# Patient Record
Sex: Male | Born: 1935 | Race: White | Hispanic: No | Marital: Single | Smoking: Former smoker
Health system: Southern US, Community
[De-identification: ages and names within clinical notes are randomized; demographics above are authoritative.]

## PROBLEM LIST (undated history)

## (undated) DIAGNOSIS — I10 Essential (primary) hypertension: Secondary | ICD-10-CM

## (undated) DIAGNOSIS — N390 Urinary tract infection, site not specified: Secondary | ICD-10-CM

## (undated) DIAGNOSIS — R918 Other nonspecific abnormal finding of lung field: Secondary | ICD-10-CM

## (undated) DIAGNOSIS — E7800 Pure hypercholesterolemia, unspecified: Secondary | ICD-10-CM

## (undated) DIAGNOSIS — C3490 Malignant neoplasm of unspecified part of unspecified bronchus or lung: Secondary | ICD-10-CM

## (undated) DIAGNOSIS — E079 Disorder of thyroid, unspecified: Secondary | ICD-10-CM

## (undated) DIAGNOSIS — R5383 Other fatigue: Secondary | ICD-10-CM

## (undated) DIAGNOSIS — N289 Disorder of kidney and ureter, unspecified: Secondary | ICD-10-CM

## (undated) DIAGNOSIS — M79673 Pain in unspecified foot: Secondary | ICD-10-CM

## (undated) DIAGNOSIS — K922 Gastrointestinal hemorrhage, unspecified: Secondary | ICD-10-CM

## (undated) DIAGNOSIS — I2510 Atherosclerotic heart disease of native coronary artery without angina pectoris: Secondary | ICD-10-CM

## (undated) DIAGNOSIS — K8020 Calculus of gallbladder without cholecystitis without obstruction: Secondary | ICD-10-CM

## (undated) DIAGNOSIS — D649 Anemia, unspecified: Secondary | ICD-10-CM

## (undated) DIAGNOSIS — C801 Malignant (primary) neoplasm, unspecified: Secondary | ICD-10-CM

## (undated) DIAGNOSIS — E669 Obesity, unspecified: Secondary | ICD-10-CM

## (undated) HISTORY — DX: Pain in unspecified foot: M79.673

## (undated) HISTORY — PX: ELBOW SURGERY: SHX618

## (undated) HISTORY — DX: Calculus of gallbladder without cholecystitis without obstruction: K80.20

## (undated) HISTORY — DX: Pure hypercholesterolemia, unspecified: E78.00

## (undated) HISTORY — PX: CATARACT EXTRACTION: SUR2

## (undated) HISTORY — DX: Malignant (primary) neoplasm, unspecified: C80.1

## (undated) HISTORY — PX: CHOLECYSTECTOMY: SHX55

## (undated) HISTORY — PX: ESOPHAGEAL DILATION: SHX303

## (undated) HISTORY — DX: Anemia, unspecified: D64.9

## (undated) HISTORY — PX: OTHER SURGICAL HISTORY: SHX169

## (undated) HISTORY — DX: Essential (primary) hypertension: I10

## (undated) HISTORY — DX: Atherosclerotic heart disease of native coronary artery without angina pectoris: I25.10

## (undated) HISTORY — DX: Malignant neoplasm of unspecified part of unspecified bronchus or lung: C34.90

## (undated) HISTORY — DX: Gastrointestinal hemorrhage, unspecified: K92.2

## (undated) HISTORY — DX: Disorder of thyroid, unspecified: E07.9

## (undated) HISTORY — DX: Urinary tract infection, site not specified: N39.0

## (undated) HISTORY — DX: Disorder of kidney and ureter, unspecified: N28.9

## (undated) HISTORY — PX: PILONIDAL CYST EXCISION: SHX744

## (undated) HISTORY — DX: Other nonspecific abnormal finding of lung field: R91.8

## (undated) HISTORY — DX: Obesity, unspecified: E66.9

## (undated) HISTORY — DX: Other fatigue: R53.83

---

## 2005-07-29 HISTORY — PX: COLONOSCOPY W/ POLYPECTOMY: SHX1380

## 2008-12-30 ENCOUNTER — Ambulatory Visit (HOSPITAL_COMMUNITY): Admission: RE | Admit: 2008-12-30 | Discharge: 2008-12-30 | Payer: Self-pay | Admitting: Internal Medicine

## 2009-06-01 ENCOUNTER — Ambulatory Visit: Payer: Self-pay | Admitting: Cardiothoracic Surgery

## 2009-06-07 ENCOUNTER — Encounter: Payer: Self-pay | Admitting: Cardiovascular Disease

## 2009-06-08 ENCOUNTER — Encounter: Payer: Self-pay | Admitting: Cardiovascular Disease

## 2009-06-08 ENCOUNTER — Ambulatory Visit: Payer: Self-pay | Admitting: Cardiothoracic Surgery

## 2009-06-08 DIAGNOSIS — E0590 Thyrotoxicosis, unspecified without thyrotoxic crisis or storm: Secondary | ICD-10-CM | POA: Insufficient documentation

## 2009-06-12 DIAGNOSIS — R222 Localized swelling, mass and lump, trunk: Secondary | ICD-10-CM

## 2009-06-12 DIAGNOSIS — K8020 Calculus of gallbladder without cholecystitis without obstruction: Secondary | ICD-10-CM | POA: Insufficient documentation

## 2009-06-12 DIAGNOSIS — Z8711 Personal history of peptic ulcer disease: Secondary | ICD-10-CM | POA: Insufficient documentation

## 2009-06-12 DIAGNOSIS — E669 Obesity, unspecified: Secondary | ICD-10-CM | POA: Insufficient documentation

## 2009-06-12 DIAGNOSIS — E119 Type 2 diabetes mellitus without complications: Secondary | ICD-10-CM

## 2009-06-12 DIAGNOSIS — M79609 Pain in unspecified limb: Secondary | ICD-10-CM

## 2009-06-12 DIAGNOSIS — E7800 Pure hypercholesterolemia, unspecified: Secondary | ICD-10-CM | POA: Insufficient documentation

## 2009-06-12 DIAGNOSIS — I2510 Atherosclerotic heart disease of native coronary artery without angina pectoris: Secondary | ICD-10-CM | POA: Insufficient documentation

## 2009-06-12 DIAGNOSIS — N289 Disorder of kidney and ureter, unspecified: Secondary | ICD-10-CM | POA: Insufficient documentation

## 2009-06-12 DIAGNOSIS — R5381 Other malaise: Secondary | ICD-10-CM

## 2009-06-12 DIAGNOSIS — D649 Anemia, unspecified: Secondary | ICD-10-CM | POA: Insufficient documentation

## 2009-06-12 DIAGNOSIS — R5383 Other fatigue: Secondary | ICD-10-CM

## 2009-06-12 DIAGNOSIS — I10 Essential (primary) hypertension: Secondary | ICD-10-CM | POA: Insufficient documentation

## 2009-06-12 DIAGNOSIS — N390 Urinary tract infection, site not specified: Secondary | ICD-10-CM | POA: Insufficient documentation

## 2009-06-13 ENCOUNTER — Encounter (INDEPENDENT_AMBULATORY_CARE_PROVIDER_SITE_OTHER): Payer: Self-pay | Admitting: *Deleted

## 2009-06-13 ENCOUNTER — Ambulatory Visit: Payer: Self-pay | Admitting: Cardiovascular Disease

## 2009-06-13 LAB — CONVERTED CEMR LAB
BUN: 21 mg/dL (ref 6–23)
Basophils Relative: 0.5 % (ref 0.0–3.0)
Calcium: 9.4 mg/dL (ref 8.4–10.5)
Eosinophils Absolute: 0.1 10*3/uL (ref 0.0–0.7)
Eosinophils Relative: 2 % (ref 0.0–5.0)
Glucose, Bld: 86 mg/dL (ref 70–99)
HCT: 44.3 % (ref 39.0–52.0)
Lymphocytes Relative: 24.3 % (ref 12.0–46.0)
Lymphs Abs: 1.8 10*3/uL (ref 0.7–4.0)
Monocytes Absolute: 0.7 10*3/uL (ref 0.1–1.0)
Monocytes Relative: 9.2 % (ref 3.0–12.0)
Neutrophils Relative %: 64 % (ref 43.0–77.0)
Potassium: 4.6 meq/L (ref 3.5–5.1)
Prothrombin Time: 11.2 s (ref 9.1–11.7)
Sodium: 142 meq/L (ref 135–145)

## 2009-06-14 ENCOUNTER — Inpatient Hospital Stay (HOSPITAL_COMMUNITY): Admission: AD | Admit: 2009-06-14 | Discharge: 2009-06-17 | Payer: Self-pay | Admitting: Cardiovascular Disease

## 2009-06-14 ENCOUNTER — Ambulatory Visit: Payer: Self-pay | Admitting: Cardiovascular Disease

## 2009-06-14 ENCOUNTER — Encounter: Payer: Self-pay | Admitting: Cardiovascular Disease

## 2009-06-14 ENCOUNTER — Ambulatory Visit: Payer: Self-pay | Admitting: Cardiothoracic Surgery

## 2009-06-14 ENCOUNTER — Inpatient Hospital Stay (HOSPITAL_BASED_OUTPATIENT_CLINIC_OR_DEPARTMENT_OTHER): Admission: RE | Admit: 2009-06-14 | Discharge: 2009-06-14 | Payer: Self-pay | Admitting: Cardiovascular Disease

## 2009-06-16 ENCOUNTER — Encounter: Payer: Self-pay | Admitting: Cardiovascular Disease

## 2009-06-19 ENCOUNTER — Telehealth: Payer: Self-pay | Admitting: Cardiovascular Disease

## 2009-06-26 ENCOUNTER — Telehealth: Payer: Self-pay | Admitting: Cardiovascular Disease

## 2009-06-28 HISTORY — PX: ELECTROMAGNETIC NAVIGATION BROCHOSCOPY: SHX5369

## 2009-06-28 HISTORY — PX: OTHER SURGICAL HISTORY: SHX169

## 2009-07-12 ENCOUNTER — Encounter: Payer: Self-pay | Admitting: Cardiovascular Disease

## 2009-07-27 ENCOUNTER — Ambulatory Visit: Payer: Self-pay | Admitting: Cardiothoracic Surgery

## 2009-07-27 ENCOUNTER — Encounter: Admission: RE | Admit: 2009-07-27 | Discharge: 2009-07-27 | Payer: Self-pay | Admitting: Cardiothoracic Surgery

## 2009-07-27 ENCOUNTER — Ambulatory Visit: Payer: Self-pay | Admitting: Cardiovascular Disease

## 2009-09-21 ENCOUNTER — Telehealth: Payer: Self-pay | Admitting: Cardiovascular Disease

## 2009-09-22 ENCOUNTER — Telehealth: Payer: Self-pay | Admitting: Cardiovascular Disease

## 2009-10-17 ENCOUNTER — Ambulatory Visit: Payer: Self-pay | Admitting: Cardiovascular Disease

## 2009-10-26 ENCOUNTER — Encounter: Admission: RE | Admit: 2009-10-26 | Discharge: 2009-10-26 | Payer: Self-pay | Admitting: Cardiothoracic Surgery

## 2009-10-26 ENCOUNTER — Ambulatory Visit: Payer: Self-pay | Admitting: Cardiothoracic Surgery

## 2010-01-18 ENCOUNTER — Encounter: Admission: RE | Admit: 2010-01-18 | Discharge: 2010-01-18 | Payer: Self-pay | Admitting: Cardiothoracic Surgery

## 2010-01-18 ENCOUNTER — Ambulatory Visit: Payer: Self-pay | Admitting: Cardiothoracic Surgery

## 2010-01-18 ENCOUNTER — Encounter: Payer: Self-pay | Admitting: Cardiovascular Disease

## 2010-03-15 ENCOUNTER — Telehealth: Payer: Self-pay | Admitting: Cardiovascular Disease

## 2010-04-11 ENCOUNTER — Ambulatory Visit: Payer: Self-pay | Admitting: Cardiovascular Disease

## 2010-07-09 ENCOUNTER — Telehealth: Payer: Self-pay | Admitting: Cardiovascular Disease

## 2010-07-19 ENCOUNTER — Encounter
Admission: RE | Admit: 2010-07-19 | Discharge: 2010-07-19 | Payer: Self-pay | Source: Home / Self Care | Attending: Cardiothoracic Surgery | Admitting: Cardiothoracic Surgery

## 2010-07-19 ENCOUNTER — Encounter: Payer: Self-pay | Admitting: Cardiovascular Disease

## 2010-07-19 ENCOUNTER — Ambulatory Visit: Payer: Self-pay | Admitting: Cardiothoracic Surgery

## 2010-08-28 NOTE — Assessment & Plan Note (Signed)
Summary: F6M/DM   CC:  check up.  History of Present Illness: Brent Mcguire is seen today for F/U of CAD and dyspnea.  He has a tight ostial circ lesion that is not amenable to PCI due to its proximity to the LM.  He had succesful left thoracotomy in [place] by Dr Tyrone Sage the first week in December.  The lung nodule was cancerous but it appears to be contained by surgery.  He had some hemoroidal bleeding and his Plavix in the past.  He is on Dexilant for reflux and I told him Protonix might be best once his samples run out in regards to an interaction with his antiplatlet drugs.  If he needs to be on long term H2 blockade checking his AD2P reactivity and platlet inhibition may be worthwhile. He has not had any SSCP, palpitations, dyspnea syncope or edema.   Since I last saw him he had his gallbladder out in [place].  He had no cardiac complications.  Since he has been on Plavix he has not had any angina.  Since he does have a critical ostial circ lesion not amenable to PCI I prefer to keep him on Plavix to prevent MI.  If his angina returns he may be a candidate for CABG now that his lung CA seems to be cured.  Dr Tyrone Sage indicated he did not need adjuvant XRT  I encouraged him to try to lose weight.  He will have a F/U myovue to check ishcemic burden in 6 months.  Current Problems (verified): 1)  Coronary Atherosclerosis  (ICD-414.00) 2)  Hypertension  (ICD-401.9) 3)  Uti  (ICD-599.0) 4)  Mass, Lung  (ICD-786.6) 5)  Foot Pain  (ICD-729.5) 6)  Fatigue  (ICD-780.79) 7)  Kidney Disease  (ICD-593.9) 8)  Cholelithiasis  (ICD-574.20) 9)  Anemia  (ICD-285.9) 10)  Gastric Ulcer, Hx of  (ICD-V12.71) 11)  Dm  (ICD-250.00) 12)  Hypercholesterolemia  (ICD-272.0) 13)  Obesity  (ICD-278.00) 14)  Hyperthyroidism  (ICD-242.90)  Current Medications (verified): 1)  Levothroid 137 Mcg Tabs (Levothyroxine Sodium) .Marland Kitchen.. 1 Tab By Mouth Once Daily 2)  Doxazosin Mesylate 8 Mg Tabs (Doxazosin Mesylate) .Marland Kitchen.. 1 Tab  By Mouth Once Daily 3)  Simvastatin 40 Mg Tabs (Simvastatin) .... Take One Tablet By Mouth Daily At Bedtime 4)  Aspirin 81 Mg Tbec (Aspirin) .... Take One Tablet By Mouth Daily 5)  Triamterene-Hctz 37.5-25 Mg Tabs (Triamterene-Hctz) .... One Half Tab By Mouth Once Daily 6)  Carvedilol 6.25 Mg Tabs (Carvedilol) .... Take One Tablet By Mouth Twice A Day 7)  Multivitamins   Tabs (Multiple Vitamin) .Marland Kitchen.. 1 Tab By Mouth Once Daily 8)  Dexilant 60 Mg Cpdr (Dexlansoprazole) .Marland Kitchen.. 1 Tab By Mouth Once Daily 9)  Plavix 75 Mg Tabs (Clopidogrel Bisulfate) .... Take One Tablet By Mouth Daily 10)  Fish Oil 1000 Mg Caps (Omega-3 Fatty Acids) .... One Tablet By Mouth Once Daily  Allergies (verified): No Known Drug Allergies  Past History:  Past Medical History: Last updated: 06/08/2009 Current Problems:  CORONARY ATHEROSCLEROSIS (ICD-414.00) needs lung resection had positive myouve in [place] with Dr. Sherlyn Lick inferolateral ischemia.  Smoker.  Evaluation for cath pre-surgery HYPERTENSION (ICD-401.9) UTI (ICD-599.0) MASS, LUNG (ICD-786.6) evaluated by Tyrone Sage PET borderline positive.  ? lung resection or biopsy FOOT PAIN (ICD-729.5) FATIGUE (ICD-780.79) KIDNEY DISEASE (ICD-593.9) CHOLELITHIASIS (ICD-574.20) ANEMIA (ICD-285.9) GASTRIC ULCER, HX OF (ICD-V12.71) DM (ICD-250.00) HYPERCHOLESTEROLEMIA (ICD-272.0) OBESITY (ICD-278.00) HYPERTHYROIDISM (ICD-242.90) GI bleed about a year ago  Significant for gallstones hyperplasia  Past Surgical History:  Last updated: 06/08/2009  Elbow surgery and pilonidal cyst years ago cataract surgery colonoscopic polypectomy via colostomy; 3 polyps. Brent Mcguire 2007 pilonidal cyst resection-simple treatment of elbow fracture  Family History: Last updated: 06/08/2009 No previous history of lung cancer in his family.   Denies alcohol use.   Social History: Last updated: 06/08/2009  The patient is single, lives alone, retired in [employer].      Review of Systems        Denies fever, malais, weight loss, blurry vision, decreased visual acuity, cough, sputum,, hemoptysis, pleuritic pain, palpitaitons, heartburn, abdominal pain, melena, lower extremity edema, claudication, or rash.   Vital Signs:  Patient profile:   76 year old male Height:      73 inches Weight:      283 pounds BMI:     37.47 Pulse rate:   94 / minute Resp:     12 per minute BP sitting:   121 / 78  (left arm)  Vitals Entered By: Brent Mcguire (April 11, 2010 3:19 PM)  Physical Exam  General:  Affect appropriate Healthy:  appears stated age HEENT: normal Neck supple with no adenopathy JVP normal no bruits no thyromegaly Lungs clear with no wheezing and good diaphragmatic motion  Decreased breath sounds left side Heart:  S1/S2 no murmur,rub, gallop or click PMI normal Abdomen: benighn, BS positve, no tenderness, no AAA no bruit.  No HSM or HJR Distal pulses intact with no bruits No edema Neuro non-focal Skin warm and dry S/P left thoracotomy   Impression & Recommendations:  Problem # 1:  CORONARY ATHEROSCLEROSIS (ICD-414.00) Stable no angina. Ostial circ.  F/U myovue 6 months His updated medication list for this problem includes:    Aspirin 81 Mg Tbec (Aspirin) .Marland Kitchen... Take one tablet by mouth daily    Carvedilol 6.25 Mg Tabs (Carvedilol) .Marland Kitchen... Take one tablet by mouth twice a day    Plavix 75 Mg Tabs (Clopidogrel bisulfate) .Marland Kitchen... Take one tablet by mouth daily  Problem # 2:  HYPERTENSION (ICD-401.9) Well controlled His updated medication list for this problem includes:    Doxazosin Mesylate 8 Mg Tabs (Doxazosin mesylate) .Marland Kitchen... 1 tab by mouth once daily    Aspirin 81 Mg Tbec (Aspirin) .Marland Kitchen... Take one tablet by mouth daily    Triamterene-hctz 37.5-25 Mg Tabs (Triamterene-hctz) ..... One half tab by mouth once daily    Carvedilol 6.25 Mg Tabs (Carvedilol) .Marland Kitchen... Take one tablet by mouth twice a day  Problem # 3:  HYPERCHOLESTEROLEMIA (ICD-272.0) Continue statin.   Labs in 6 months His updated medication list for this problem includes:    Simvastatin 40 Mg Tabs (Simvastatin) .Marland Kitchen... Take one tablet by mouth daily at bedtime  Problem # 4:  MASS, LUNG (ICD-786.6) Chronic dyspnea related to recentl left lung resection.  Low carb diet and exercise program discussed.  F/U [facility] surv. CT/CXR EF normal.  F/U myovue in 6 months  Patient Instructions: 1)  Your physician recommends that you schedule a follow-up appointment in: 6 MONTHS WITH DR Eden Emms 2)  Your physician recommends that you continue on your current medications as directed. Please refer to the Current Medication list given to you today.

## 2010-08-28 NOTE — Progress Notes (Signed)
Summary: need help with plavix  Phone Note Call from Patient Call back at Home Phone [phone]   Caller: Patient Reason for Call: Talk to Nurse Details for Reason: per pt calling, insurance won't cover plavix anymore, [facility] so he can receive help.  Initial call taken by: Lorne Skeens,  September 22, 2009 8:38 AM  Follow-up for Phone Call        spoke with pt, copy of last office [phone] copy of note also mailed to pt Deliah Goody, RN  September 22, 2009 9:40 AM

## 2010-08-28 NOTE — Letter (Signed)
Summary: [facility]  [facility]   Imported By: Roderic Ovens 08/01/2009 15:15:07  _____________________________________________________________________  External Attachment:    Type:   Image     Comment:   External Document

## 2010-08-28 NOTE — Miscellaneous (Signed)
  Clinical Lists Changes  Orders: Added new Referral order of Nuclear Stress Test (Nuc Stress Test) - Signed Observations: Added new observation of PI CARDIO: Your physician has requested that you have an exercise stress myoview.  For further information please visit [email or web address]  Please follow instruction sheet, as given. (04/11/2010 16:06)      Patient Instructions: 1)  Your physician has requested that you have an exercise stress myoview.  For further information please visit [email or web address]  Please follow instruction sheet, as given.

## 2010-08-28 NOTE — Cardiovascular Report (Signed)
Summary: [facility]   [facility]   Imported By: Roderic Ovens 08/09/2009 15:14:53  _____________________________________________________________________  External Attachment:    Type:   Image     Comment:   External Document

## 2010-08-28 NOTE — Letter (Signed)
Summary: [facility]   [facility]   Imported By: Roderic Ovens 02/10/2010 11:25:06  _____________________________________________________________________  External Attachment:    Type:   Image     Comment:   External Document

## 2010-08-28 NOTE — Progress Notes (Signed)
Summary: refill request  Phone Note Refill Request Message from:  Patient on March 15, 2010 8:52 AM  Refills Requested: Medication #1:  CARVEDILOL 6.25 MG TABS Take one tablet by mouth twice a day  Medication #2:  SIMVASTATIN 40 MG TABS Take one tablet by mouth daily at bedtime  Medication #3:  PLAVIX 75 MG TABS Take one tablet by mouth daily pls call to [facility] mail order   Method Requested: Telephone to Pharmacy Initial call taken by: Glynda Jaeger,  March 15, 2010 8:52 AM    Prescriptions: PLAVIX 75 MG TABS (CLOPIDOGREL BISULFATE) Take one tablet by mouth daily  #90 x 3   Entered by:   Kem Parkinson   Authorized by:   Colon Branch, MD, [facility]   Signed by:   Kem Parkinson on 03/16/2010   Method used:   Faxed to ...       [facility] (mail-order)             , [place]         Ph: [phone]       Fax: [phone]   RxID:   [id number] CARVEDILOL 6.25 MG TABS (CARVEDILOL) Take one tablet by mouth twice a day  #180 x 3   Entered by:   Kem Parkinson   Authorized by:   Colon Branch, MD, [facility]   Signed by:   Kem Parkinson on 03/16/2010   Method used:   Faxed to ...       [facility] (mail-order)             , [place]         Ph: [phone]       Fax: [phone]   RxID:   [id number] SIMVASTATIN 40 MG TABS (SIMVASTATIN) Take one tablet by mouth daily at bedtime  #90 x 3   Entered by:   Kem Parkinson   Authorized by:   Colon Branch, MD, [facility]   Signed by:   Kem Parkinson on 03/16/2010   Method used:   Faxed to ...       [facility] (mail-order)             , [place]         Ph: [phone]       Fax: [phone]   RxID:   [id number]

## 2010-08-28 NOTE — Assessment & Plan Note (Signed)
Summary: F3M/DM   History of Present Illness: Brent Mcguire is seen today for F/U of CAD and dyspnea.  He has a tight ostial circ lesion that is not amenable to PCI due to its proximity to the LM.  He had succesful left thoracotomy in [place] by Dr Tyrone Sage the first week in December.  The lung nodule was cancerous but it appears to be contained by surgery.  He had some hemoroidal bleeding and his Plavix in the past.  He is on Dexilant for reflux and I told him Protonix might be best once his samples run out in regards to an interaction with his antiplatlet drugs.  If he needs to be on long term H2 blockade checking his AD2P reactivity and platlet inhibition may be worthwhile. He has not had any SSCP, palpitations, dyspnea syncope or edema.   Since I last saw him he had his gallbladder out in [place].  He had no cardiac complications.  Since he has been on Plavix he has not had any angina.  Since he does have a critical ostial circ lesion not amenable to PCI I prefer to keep him on Plavix to prevent MI.  If his angina returns he may be a candidate for CABG now that his lung CA seems to be cured.  Dr Tyrone Sage indicated he did not need adjuvant XRT.  Current Problems (verified): 1)  Coronary Atherosclerosis  (ICD-414.00) 2)  Hypertension  (ICD-401.9) 3)  Uti  (ICD-599.0) 4)  Mass, Lung  (ICD-786.6) 5)  Foot Pain  (ICD-729.5) 6)  Fatigue  (ICD-780.79) 7)  Kidney Disease  (ICD-593.9) 8)  Cholelithiasis  (ICD-574.20) 9)  Anemia  (ICD-285.9) 10)  Gastric Ulcer, Hx of  (ICD-V12.71) 11)  Dm  (ICD-250.00) 12)  Hypercholesterolemia  (ICD-272.0) 13)  Obesity  (ICD-278.00) 14)  Hyperthyroidism  (ICD-242.90)  Current Medications (verified): 1)  Levothroid 137 Mcg Tabs (Levothyroxine Sodium) .Marland Kitchen.. 1 Tab By Mouth Once Daily 2)  Doxazosin Mesylate 8 Mg Tabs (Doxazosin Mesylate) .Marland Kitchen.. 1 Tab By Mouth Once Daily 3)  Simvastatin 40 Mg Tabs (Simvastatin) .... Take One Tablet By Mouth Daily At Bedtime 4)  Aspirin 81  Mg Tbec (Aspirin) .... Take One Tablet By Mouth Daily 5)  Triamterene-Hctz 37.5-25 Mg Tabs (Triamterene-Hctz) .... One Half Tab By Mouth Once Daily 6)  Carvedilol 6.25 Mg Tabs (Carvedilol) .... Take One Tablet By Mouth Twice A Day 7)  Multivitamins   Tabs (Multiple Vitamin) .Marland Kitchen.. 1 Tab By Mouth Once Daily 8)  Dexilant 60 Mg Cpdr (Dexlansoprazole) .Marland Kitchen.. 1 Tab By Mouth Once Daily 9)  Plavix 75 Mg Tabs (Clopidogrel Bisulfate) .... Take One Tablet By Mouth Daily 10)  Fish Oil 1000 Mg Caps (Omega-3 Fatty Acids) .... One Tablet By Mouth Once Daily 11)  Hydrocodone-Acetaminophen 5-500 Mg Tabs (Hydrocodone-Acetaminophen) .... 2 Tablets Every 4 Hours As Needed  Allergies (verified): No Known Drug Allergies  Past History:  Past Medical History: Last updated: 06/08/2009 Current Problems:  CORONARY ATHEROSCLEROSIS (ICD-414.00) needs lung resection had positive myouve in [place] with Dr. Sherlyn Lick inferolateral ischemia.  Smoker.  Evaluation for cath pre-surgery HYPERTENSION (ICD-401.9) UTI (ICD-599.0) MASS, LUNG (ICD-786.6) evaluated by Tyrone Sage PET borderline positive.  ? lung resection or biopsy FOOT PAIN (ICD-729.5) FATIGUE (ICD-780.79) KIDNEY DISEASE (ICD-593.9) CHOLELITHIASIS (ICD-574.20) ANEMIA (ICD-285.9) GASTRIC ULCER, HX OF (ICD-V12.71) DM (ICD-250.00) HYPERCHOLESTEROLEMIA (ICD-272.0) OBESITY (ICD-278.00) HYPERTHYROIDISM (ICD-242.90) GI bleed about a year ago  Significant for gallstones hyperplasia  Past Surgical History: Last updated: 06/08/2009  Elbow surgery and pilonidal cyst years ago cataract surgery  colonoscopic polypectomy via colostomy; 3 polyps. Chales Abrahams 2007 pilonidal cyst resection-simple treatment of elbow fracture  Family History: Last updated: 06/08/2009 No previous history of lung cancer in his family.   Denies alcohol use.   Social History: Last updated: 06/08/2009  The patient is single, lives alone, retired in [employer].      Review of Systems       Denies  fever, malais, weight loss, blurry vision, decreased visual acuity, cough, sputum, SOB, hemoptysis, pleuritic pain, palpitaitons, heartburn, abdominal pain, melena, lower extremity edema, claudication, or rash.   Vital Signs:  Patient profile:   76 year old male Height:      73 inches Weight:      273 pounds BMI:     36.15 Pulse rate:   70 / minute Resp:     12 per minute BP sitting:   146 / 87  (left arm) Cuff size:   large  Vitals Entered By: Kem Parkinson (October 17, 2009 3:57 PM)  Physical Exam  General:  Affect appropriate Healthy:  appears stated age HEENT: normal Neck supple with no adenopathy JVP normal no bruits no thyromegaly Lungs clear with no wheezing and good diaphragmatic motion Heart:  S1/S2 no murmur,rub, gallop or click PMI normal Abdomen: benighn, BS positve, no tenderness, no AAA no bruit.  No HSM or HJR Distal pulses intact with no bruits No edema Neuro non-focal Skin warm and dry S/P right thoracotomy S/P lap choly   Impression & Recommendations:  Problem # 1:  CORONARY ATHEROSCLEROSIS (ICD-414.00) Should be kept on Plavix as part of his medical Rx given inablility to stent ostial circ and to prevent MI and need for CABG His updated medication list for this problem includes:    Aspirin 81 Mg Tbec (Aspirin) .Marland Kitchen... Take one tablet by mouth daily    Carvedilol 6.25 Mg Tabs (Carvedilol) .Marland Kitchen... Take one tablet by mouth twice a day    Plavix 75 Mg Tabs (Clopidogrel bisulfate) .Marland Kitchen... Take one tablet by mouth daily  Problem # 2:  HYPERTENSION (ICD-401.9) Well controlled His updated medication list for this problem includes:    Doxazosin Mesylate 8 Mg Tabs (Doxazosin mesylate) .Marland Kitchen... 1 tab by mouth once daily    Aspirin 81 Mg Tbec (Aspirin) .Marland Kitchen... Take one tablet by mouth daily    Triamterene-hctz 37.5-25 Mg Tabs (Triamterene-hctz) ..... One half tab by mouth once daily    Carvedilol 6.25 Mg Tabs (Carvedilol) .Marland Kitchen... Take one tablet by mouth twice a  day  Problem # 3:  HYPERCHOLESTEROLEMIA (ICD-272.0) F/U lipid and liver in 6 months  Target LDL 70 or less His updated medication list for this problem includes:    Simvastatin 40 Mg Tabs (Simvastatin) .Marland Kitchen... Take one tablet by mouth daily at bedtime  Problem # 4:  MASS, LUNG (ICD-786.6) S/P right lung resection by Dr Tyrone Sage.  No need for adjuvant chemo or XRT.  ? candidate for CABG if angina returns.  Patient Instructions: 1)  Your physician recommends that you schedule a follow-up appointment in: 6 MONTHS Prescriptions: PLAVIX 75 MG TABS (CLOPIDOGREL BISULFATE) Take one tablet by mouth daily  #60 x 12   Entered by:   Deliah Goody, RN   Authorized by:   Colon Branch, MD, [facility]   Signed by:   Deliah Goody, RN on 10/17/2009   Method used:   Electronically to        [facility]  [address] [phone]* (retail)       [address]  [place], [place]  [address]       Ph: [phone] or [phone]       Fax: [phone]   RxID:   [id number]

## 2010-08-28 NOTE — Letter (Signed)
Summary: [facility] Office Visit  [facility] Office Visit   Imported By: Kassie Mends 08/16/2009 13:19:19  _____________________________________________________________________  External Attachment:    Type:   Image     Comment:   External Document

## 2010-08-28 NOTE — Progress Notes (Signed)
Summary: plavix/LMOM or call back/ JR  Phone Note Call from Patient Call back at Home Phone [phone]   Caller: Patient Reason for Call: Talk to Nurse Summary of Call: plavix cost to much... pt req something else Initial call taken by: Migdalia Dk,  September 21, 2009 10:00 AM  Follow-up for Phone Call        [facility] for call back. Follow-up by: Suzan Garibaldi RN  Additional Follow-up for Phone Call Additional follow up Details #1::        Spoke with patient...Marland Kitchenhe has a $ 40.00 copay for 1 month of Plavix. He states that this is not affordable for him. Advised that he would not qualify for the indigent program for Plavix because he has insurance coverage. He will try to get the medication from the [place]. Additional Follow-up by: Suzan Garibaldi  RN

## 2010-08-30 NOTE — Progress Notes (Signed)
Summary: re stress test  Phone Note Call from Patient   Caller: Patient [phone] Reason for Call: Talk to Nurse Summary of Call: pt called to set up march fu with dr Eden Emms, was also to have stress test, however pt doesn't want to do the stress cannot walk, does dr Eden Emms want to set up something else?  Initial call taken by: Glynda Jaeger,  July 09, 2010 8:48 AM  Follow-up for Phone Call        spoke with pt, lexiscan myoview scheduled with a follow up sameday with dr Verdis Prime, RN  July 09, 2010 9:20 AM

## 2010-09-19 NOTE — Progress Notes (Signed)
Summary: [facility]: Office Visit  [facility]: Office Visit   Imported By: Earl Many 09/06/2010 17:44:14  _____________________________________________________________________  External Attachment:    Type:   Image     Comment:   External Document

## 2010-10-03 ENCOUNTER — Encounter: Payer: Self-pay | Admitting: Cardiovascular Disease

## 2010-10-17 ENCOUNTER — Encounter: Payer: Self-pay | Admitting: Cardiovascular Disease

## 2010-10-17 ENCOUNTER — Ambulatory Visit (HOSPITAL_COMMUNITY): Payer: Medicare Other | Admitting: Radiology

## 2010-10-17 ENCOUNTER — Ambulatory Visit (INDEPENDENT_AMBULATORY_CARE_PROVIDER_SITE_OTHER): Payer: Medicare Other | Admitting: Cardiovascular Disease

## 2010-10-17 DIAGNOSIS — I10 Essential (primary) hypertension: Secondary | ICD-10-CM

## 2010-10-17 DIAGNOSIS — R222 Localized swelling, mass and lump, trunk: Secondary | ICD-10-CM

## 2010-10-17 DIAGNOSIS — E7800 Pure hypercholesterolemia, unspecified: Secondary | ICD-10-CM

## 2010-10-17 NOTE — Patient Instructions (Signed)
Your physician recommends that you schedule a follow-up appointment in: 6 months  

## 2010-10-17 NOTE — Progress Notes (Signed)
Brent Mcguire is seen today for F/U of CAD and dyspnea.  He has a tight ostial circ lesion that is not amenable to PCI due to its proximity to the LM.  He had succesful left thoracotomy in [place] by Dr Tyrone Sage the first week in December.  The lung nodule was cancerous but it appears to be contained by surgery.  He had some hemoroidal bleeding and his Plavix in the past.  He is on Dexilant for reflux and I told him Protonix might be best once his samples run out in regards to an interaction with his antiplatlet drugs.  If he needs to be on long term H2 blockade checking his AD2P reactivity and platlet inhibition may be worthwhile. He has not had any SSCP, palpitations, dyspnea syncope or edema.   Since I last saw him he had his gallbladder out in [place].  He had no cardiac complications.  Since he has been on Plavix he has not had any angina.  Since he does have a critical ostial circ lesion not amenable to PCI I prefer to keep him on Plavix to prevent MI.  If his angina returns he may be a candidate for CABG now that his lung CA seems to be cured.  Dr Tyrone Sage indicated he did not need adjuvant XRT  I encouraged him to try to lose weight.  He was supposed to have a myovue today but did not want to do it due to cost.    ROS: Denies fever, malais, weight loss, blurry vision, decreased visual acuity, cough, sputum, SOB, hemoptysis, pleuritic pain, palpitaitons, heartburn, abdominal pain, melena, lower extremity edema, claudication, or rash.   General:   Affect appropriate Healthy:  appears stated age HEENT: normal Neck supple with no adenopathy JVP normal no bruits no thyromegaly Lungs clear with no wheezing and good diaphragmatic motion Heart:  S1/S2 no murmur,rub, gallop or click PMI normal Abdomen: benighn, BS positve, no tenderness, no AAA no bruit.  No HSM or HJR Distal pulses intact with no bruits No edema Neuro non-focal Skin warm and dry No muscular weakness   Current Outpatient  Prescriptions  Medication Sig Dispense Refill  . aspirin 81 MG tablet Take 81 mg by mouth daily.        . carvedilol (COREG) 6.25 MG tablet Take 6.25 mg by mouth 2 (two) times daily with a meal.        . clopidogrel (PLAVIX) 75 MG tablet Take 75 mg by mouth daily.        Marland Kitchen dexlansoprazole (DEXILANT) 60 MG capsule Take 60 mg by mouth daily.        Marland Kitchen doxazosin (CARDURA) 8 MG tablet Take 8 mg by mouth at bedtime.        Marland Kitchen levothyroxine (LEVOTHROID) 137 MCG tablet Take 137 mcg by mouth daily.        . Multiple Vitamin (MULTIVITAMIN) tablet Take 1 tablet by mouth daily.        . Omega-3 Fatty Acids (FISH OIL) 1000 MG CAPS Take by mouth daily.        . simvastatin (ZOCOR) 40 MG tablet Take 40 mg by mouth at bedtime.        . triamterene-hydrochlorothiazide (MAXZIDE-25) 37.5-25 MG per tablet 1/2 tab by mouth daily          No known allergies  Assessment and Plan

## 2010-10-17 NOTE — Assessment & Plan Note (Signed)
Continue statin.  Usually gets labs in [employer]

## 2010-10-17 NOTE — Assessment & Plan Note (Signed)
Stable no angina.  Unable to do functional study due to cost.  ECG normal today.  NSR 69 Normal ECG

## 2010-10-17 NOTE — Assessment & Plan Note (Signed)
Well controlled. -continue current meds  

## 2010-10-17 NOTE — Assessment & Plan Note (Signed)
Stable no adjuvant XRT needed  F/U CVTS and pulmonary

## 2010-10-31 LAB — POCT I-STAT GLUCOSE: Operator id: 221371

## 2010-11-29 ENCOUNTER — Ambulatory Visit: Payer: Self-pay | Admitting: Cardiothoracic Surgery

## 2010-12-11 NOTE — Assessment & Plan Note (Signed)
OFFICE VISIT   Brent Mcguire, Brent Mcguire  DOB:  Aug 19, 1934                                        July 27, 2009  CHART #:  [id number]   The patient returns to the office today in followup after a recent right  lower lobectomy on June 28, 2009, for a 1.7 cm adenocarcinoma with  negative nodes and negative margins.  The patient after discharged home  was started on Plavix because of known coronary disease.  However, after  about 2 weeks postop, he had some rectal bleeding, the Plavix was  stopped and he was directed to see his gastroenterologist in [place]  who, he tells me today, found that he had some hemorrhoidal bleeding,  but no other problems and this has resolved itself.  The patient saw Dr.  Eden Emms earlier this morning and will resume back on his Plavix.  He has  been doing well postoperatively, walking in the mall in [place] without  difficulty.  He denies any significant shortness of breath and has had  no angina.   On exam, his blood pressure is 108/66, pulse is 82, respiratory rate is  18, and O2 sats 93%.  His right VATS/thoracotomy incision is healing  well.  His lungs are clear bilaterally.  I do not appreciate any  cervical or supraclavicular adenopathy.  He has no pedal edema or calf  tenderness.   Followup chest x-ray shows small amount of right pleural fluid.  Otherwise, clear lung fields, satisfactory postoperative chest x-ray.   Overall, I am pleased with his progress.  He continues to see Dr. Eden Emms  in regard to his coronary disease, but currently has remained  asymptomatic and we will start back on his Plavix.  I have discussed  with him presenting him at the [facility], but with a stage I small adenocarcinoma, adjunctive  chemotherapy, or radiation would not be indicated.  We will continue with close followup.  I will obtain a followup chest x-  ray in 3 months and repeat CT scan in 6 months.   Sheliah Plane, MD  Electronically Signed   EG/MEDQ  D:  07/27/2009  T:  07/28/2009  Job:  [id number]   cc:   Feliciana Rossetti, MD  Dr. Marshell Garfinkel C. Eden Emms, MD, [facility]  Malkiat Dhatt, MD  Benay Pillow, RN

## 2010-12-11 NOTE — Assessment & Plan Note (Signed)
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  Prior to surgery, he had a cardiac catheterization  and had evidence of proximal circumflex disease which was asymptomatic  and was elected to treat his coronary disease medically and proceed with  the surgery, which he tolerated without difficulty.  He returns today  for 1-year followup with a CT scan.  Since he was last seen 6 months  ago, he has been doing well.  He denies any shortness of breath.  Denies  any chest pain.  He notes that he has had to decrease his walking some,  not because of cardiac symptoms, but because of increasing joint  discomfort in his knees.  He denies any hemoptysis.  His flu and  pneumococcal vaccinations are up-to-date.   PHYSICAL EXAMINATION:  His blood pressure 123/76, pulse 79, respiratory  rate is 20, and O2 sats 94%.  He is afebrile, 97.8.  I do not appreciate  any cervical or supraclavicular adenopathy.  His lungs are clear  bilaterally.  Abdominal exam reveals moderate obesity without palpable  masses.  He has no calf tenderness.   Followup CT scan is done that shows postoperative scarring at the right  lung base.  There is a tiny nodule in the left lung apex which appears  stable compared to previous studies.  There are no new lung nodules  noted.  He does have coronary artery calcifications noted.   MEDICATIONS:  He continues on simvastatin, doxazosin, triamterene,  Plavix, aspirin, Coreg, Synthroid, and fish oil.   IMPRESSION:  Overall, he seems to be making good progress.  He notes  that he continues to see Dr. Eden Emms in followup for his cardiac disease  and  thought he had a stress test scheduled for March of this year.   Sheliah Plane, MD  Electronically Signed   EG/MEDQ  D:  07/19/2010  T:  07/19/2010  Job:  [id number]   cc:   Noralyn Pick. Eden Emms, MD, [facility]  Feliciana Rossetti, MD

## 2010-12-11 NOTE — Assessment & Plan Note (Signed)
OFFICE VISIT   Brent Mcguire, Brent Mcguire  DOB:  1935/04/01                                        June 08, 2009  CHART #:  [id number]   HISTORY:  The patient returns to the office after being seen last week  for the incidental finding of a right lower lobe lung nodule that was  borderline activity on a PET scan.  Our greatest concern was that  without old scans to compare that this could possibly be a  bronchioalveolar carcinoma in a previous smoker.  In evaluating the  patient for potential resection, it was noted that his CT scan showed  significant amount of coronary calcification.  He has been a smoker in  the past, and at that time saw him, he had just had a stress test done  by Dr. Sherlyn Lick in [place], but we do not have the results of that.  Since  I saw him last week, PFTs were performed and the results of Dr. Sherlyn Lick  stress test showed mild inferolateral ischemia, ejection fraction 55%.  The patient has been referred to Dr. Eden Emms.  At the patient's request,  Dr. Eden Emms previously took care of the patient's sister.   LABORATORY DATA:  Pulmonary functions studies showed a normal diffusion  capacity and FEV-1 of 2.83, FVC of 3.65.  His blood gas showed pH 7.39,  pCO2 of 40, pO2 of 73 on room air.   PHYSICAL EXAMINATION:  His blood pressure 138/80, pulse 94, respiratory  rate is 18, O2 sats 97%.  His lungs are clear.  He has no cervical or  supraclavicular adenopathy.  Physical exam is unchanged from the week  before.   PLAN:  I have discussed further with the patient the strategy for the  borderline activity of a right lung nodule on a previous smoker, and  after proceeding directly with surgical resection versus needle biopsy  at this point, the patient prefers to have a needle biopsy.  If it is  positive for malignancy, we will plan to proceed with right lower  lobectomy or wedge resection after cardiology clearance.  If it is  negative, the patient  understands that it could be false negative and we  would continue to need close radiographic follow up on the area in the  right lower lobe.  The  patient is agreeable with this.  We have contacted Dr. Fabio Bering office  to make sure that he has an appointment which is arranged for next week.   Sheliah Plane, MD  Electronically Signed   EG/MEDQ  D:  06/08/2009  T:  06/08/2009  Job:  [id number]   cc:   Feliciana Rossetti, MD  Dr. Katrinka Blazing in [facility].  Malkiat Dhatt, M.D.  Noralyn Pick. Eden Emms, MD, [facility]  Benay Pillow, RN

## 2010-12-11 NOTE — Assessment & Plan Note (Signed)
OFFICE VISIT   Brent Mcguire, Brent Mcguire  DOB:  [birth year]                                        October 26, 2009  CHART #:  [id number]   HISTORY:  The patient returns to the office today with a followup chest  x-ray 3 months after his right lower lobectomy for a stage I pathologic  stage T1A N0 M0 adenocarcinoma of the right lower lobe.  Since surgery,  the patient developed episodic colicky abdominal pain and underwent  laparoscopic cholecystectomy and is recovering from that.  Otherwise, he  seems to be making good recovery following his thoracotomy.  He still  has some shortness of breath with the exertion, but this appears to be  very mild.  He denies any chest pain, chest tightness, or any symptoms  of angina or congestive heart failure.   PHYSICAL EXAMINATION:  His blood pressure 118/64, pulse 88, respiratory  rate is 18, O2 sats 95%.  His VATS, lobectomy incisions are healing  well.  He has no cervical, supraclavicular adenopathy.  His laparoscopic  incisions also healed and he has no calf tenderness.   Follow up chest x-ray shows no evidence of recurrent tumor with clear  lung fields.   Before the patient's visit, I discussed the case with Dr. Eden Emms.  Prior  to resection of his right lower lobe, cardiac catheterization was  performed and the patient had a totally occluded collateralized right  coronary and a fairly small nondominant circumflex with an ostial  stenosis.  The lateral wall was primarily supplied by a large  bifurcating intermediate vessel that is not compromised.  However, the  issue was discussed about whether to proceed with coronary artery bypass  grafting or whether to wait longer.  At this point, recovering from the  recent gallbladder surgery, the patient is not interested in any further  surgery at this time.  I have discussed with him the need, should he  have any cardiac symptoms to contact us immediately.  He will  continue on  Plavix and I do plan to see him back with a repeat CT scan  in 3 months and will then further address his coronary anatomy  treatment.   Sheliah Plane, MD  Electronically Signed   EG/MEDQ  D:  10/26/2009  T:  10/27/2009  Job:  [id number]   cc:   Noralyn Pick. Eden Emms, MD, [facility]  Feliciana Rossetti, MD

## 2010-12-11 NOTE — Assessment & Plan Note (Signed)
OFFICE VISIT   CAMDEN, KNOTEK  DOB:  [birth year]                                        January 18, 2010  CHART #:  [id number]   HISTORY:  The patient returns today for 2-month followup after his right  lower lobectomy and lymph node dissection for adenocarcinoma of the  lung.  He was originally resected in [facility] for a pT1a N0 M0 well-  differentiated adenocarcinoma of the right lower lobe.  No further  treatment was recommended.  Prior to the lung surgery, the patient did  have a cardiac workup and had asymptomatic circumflex disease and was  elected to continue to treat it medically and keep him on Plavix.  He  returns to the office today with a followup CT scan, now 6 months after  his original lung resection.  At this point, he notes that he feels  excellent, denies any definite limitations, denies shortness of breath,  denies hemoptysis and is very specific that he has no chest discomfort,  anginal symptoms, or anginal equivalents.  Denies any indigestion that  could be mistaken for true angina.   PHYSICAL EXAMINATION:  His blood pressure 132/77, pulse is 80,  respiratory rate is 18, and O2 sats 93%.  His lungs are clear  bilaterally.  Cardiac exam reveals a regular rate and rhythm without  murmur or gallop.  Abdominal exam reveals moderately obese abdomen  without palpable masses or tenderness.  I do not appreciate any cervical  or supraclavicular adenopathy.  He has no pedal edema.  He has no calf  tenderness.   MEDICATIONS:  He continues on:  1. Simvastatin 40 mg a day.  2. Dioxane 4 mg twice a day.  3. Omeprazole.  4. Triamterene 37 mg a day.  5. Plavix 75 mg a day.  6. Vicodin p.r.n.  7. Aspirin 81 mg a day.  8. Coreg 6.25 b.i.d.  9. Dexilant 60 mg a day.  10.Fish oil 1000 mg b.i.d.  11.Synthroid 137 mcg a day.   DIAGNOSTIC TESTS:  CT scan of the chest shows satisfactory postop  changes.  There is no evidence of recurrent lung nodules  or metastatic  disease in the lung.   IMPRESSION:  I have reviewed the CT findings with him.  Discussed with  him the known coronary disease that he has in one-vessel.  He notes that  currently he is asymptomatic and is not interested in any further  treatment, which I think considering his current symptoms and status is  appropriate.  He will continue his Plavix.  He has a followup  appointment with Dr. Eden Emms, and I plan to see him back in 6 months with  the CT scan of the chest.   Sheliah Plane, MD  Electronically Signed   EG/MEDQ  D:  01/18/2010  T:  01/18/2010  Job:  [id number]   cc:   Noralyn Pick. Eden Emms, MD, [facility]  Feliciana Rossetti, MD  Dr. Campbell Lerner  Dr. Chancy Hurter, MD

## 2010-12-14 NOTE — Assessment & Plan Note (Signed)
OFFICE VISIT   Brent Mcguire, Brent Mcguire  DOB:  Oct 24, 1934                                        July 12, 2009  CHART #:  [id number]   The patient calls today to the [place] office, doing well after his  recent lung resection.  No complaints of shortness of breath, some chest  wall soreness.  He called today because he noticed some red blood per  rectum.  He had postoperatively been started on Plavix because of known  coronary disease, asymptomatic diagnosed by cardiac cath done as a preop  evaluation to his thoracotomy.  He noticed that Dr. Chales Abrahams in [place],  gastroenterologist, had previously seen him and about a year or year and  half ago, he had a colonoscopy.  I have advised him today to hold his  Plavix, and he is going to get an appointment to see Dr. Chales Abrahams in the  next 1-2 days to make sure there is no significant issue that would  prevent [place] from continue on Plavix.  Once this has resolved, we will  resume his Plavix back.   Sheliah Plane, MD  Electronically Signed   EG/MEDQ  D:  07/12/2009  T:  07/12/2009  Job:  [id number]   cc:   Noralyn Pick. Eden Emms, MD, [facility]  Lynann Bologna, MD

## 2011-02-01 ENCOUNTER — Other Ambulatory Visit: Payer: Self-pay | Admitting: Cardiovascular Disease

## 2011-02-06 ENCOUNTER — Other Ambulatory Visit: Payer: Self-pay | Admitting: Cardiothoracic Surgery

## 2011-02-06 DIAGNOSIS — C3430 Malignant neoplasm of lower lobe, unspecified bronchus or lung: Secondary | ICD-10-CM

## 2011-02-07 ENCOUNTER — Ambulatory Visit
Admission: RE | Admit: 2011-02-07 | Discharge: 2011-02-07 | Disposition: A | Discharge status: Home / Self Care | Payer: Medicare Other | Source: Ambulatory Visit | Attending: Cardiothoracic Surgery | Admitting: Cardiothoracic Surgery

## 2011-02-07 ENCOUNTER — Ambulatory Visit (INDEPENDENT_AMBULATORY_CARE_PROVIDER_SITE_OTHER): Payer: Medicare Other | Admitting: Cardiothoracic Surgery

## 2011-02-07 ENCOUNTER — Other Ambulatory Visit: Payer: Self-pay | Admitting: Cardiothoracic Surgery

## 2011-02-07 ENCOUNTER — Encounter: Payer: Self-pay | Admitting: Cardiothoracic Surgery

## 2011-02-07 VITALS — BP 150/85 | HR 68 | Resp 20

## 2011-02-07 DIAGNOSIS — C3490 Malignant neoplasm of unspecified part of unspecified bronchus or lung: Secondary | ICD-10-CM

## 2011-02-07 DIAGNOSIS — Z09 Encounter for follow-up examination after completed treatment for conditions other than malignant neoplasm: Secondary | ICD-10-CM

## 2011-02-07 DIAGNOSIS — C3430 Malignant neoplasm of lower lobe, unspecified bronchus or lung: Secondary | ICD-10-CM

## 2011-02-07 NOTE — Progress Notes (Signed)
Brent Mcguire Date of Birth: [birth year]   History of Present Illness: Brent Mcguire returns to the office today in followup after resection of a right lower lobe adenocarcinoma the lung T1aN0M0 resected 06/28/2009. Prior to surgery he had a cardiac catheterization with evidence of proximal circumflex disease which has been treated medically. Followup CT scans in June of 2011 and December 2011 showed no evidence of recurrent tumor but do show a left chest wall lipoma.  Since last seen the patient has had no cardiac symptoms. He complains of some right knee pain or bands him ambulating and exercising. He said no pulmonary symptoms no hemoptysis.  He returns today for followup chest x-ray.  Current Outpatient Prescriptions on File Prior to Visit  Medication Sig Dispense Refill  . aspirin 81 MG tablet Take 81 mg by mouth daily.        . carvedilol (COREG) 6.25 MG tablet Take 6.25 mg by mouth 2 (two) times daily with a meal.        . clopidogrel (PLAVIX) 75 MG tablet TAKE 1 TABLET DAILY  90 tablet  2  . dexlansoprazole (DEXILANT) 60 MG capsule Take 60 mg by mouth daily.        Marland Kitchen doxazosin (CARDURA) 8 MG tablet Take 8 mg by mouth at bedtime.        Marland Kitchen levothyroxine (LEVOTHROID) 137 MCG tablet Take 137 mcg by mouth daily.        . Omega-3 Fatty Acids (FISH OIL) 1000 MG CAPS Take by mouth daily.        . simvastatin (ZOCOR) 40 MG tablet Take 40 mg by mouth at bedtime.        . triamterene-hydrochlorothiazide (MAXZIDE-25) 37.5-25 MG per tablet 1/2 tab by mouth daily       . Multiple Vitamin (MULTIVITAMIN) tablet Take 1 tablet by mouth daily.        . pantoprazole (PROTONIX) 40 MG tablet       . TRIAMTERENE-HCTZ PO         Not on File  Past Medical History  Diagnosis Date  . Coronary artery disease   . Hypertension   . Thyroid disease   . UTI (lower urinary tract infection)   . Lung mass   . Foot pain   . Fatigue   . Kidney disease   . Cholelithiases   . Anemia   . Gastric ulcer   .  Diabetes mellitus   . Hypercholesteremia   . Obesity   . GI bleed   . Gallstones   . Adenocarcinoma 07/08/2011    Right Lower Lobe of Lung    Past Surgical History  Procedure Date  . Elbow surgery     pilonidal cyst  . Cataract extraction   . Colonoscopy w/ polypectomy 2007    3 polyps  . Pilonidal cyst excision   . Electromagnetic navigation brochoscopy 06/28/2009    Dr. Tyrone Sage  . Video assisted thoracoscopy, mini thoracotomy,right lower lobectomy with lymph node dissection 06/28/2009    Dr. Tyrone Sage    History  Smoking status  . Never Smoker   Smokeless tobacco  . Not on file    History  Alcohol Use No    No family history on file.  Review of Systems: The review of systems is positive for rt knee pain with ambulation All other systems were reviewed and are negative.  Physical Exam: BP 150/85  Pulse 68  Resp 20  SpO2 94% On physical exam patient appears unchanged  do not appreciate any cervical or supraclavicular adenopathy he does appear to have gained some weight he is now at 308 pounds and  I do not appreciate any cervical or supraclavicular adenopathy his lungs are clear bilaterally.  On abdominal exam he has no palpable masses or tenderness. He has no pedal edema   LABORATORY DATA: IMPRESSION: Chest x-ray today shows the following 1. Decreased size of a small right pleural effusion.  2. Poor inspiration with minimal bibasilar atelectasis.  3. Apparent increase in the size of a left basilar pleural or  extrapleural lipoma. The apparent increase in size may be  artifactual due to the decreased inspiration and atelectasis in  that area.   Assessment / Plan:   With the suggestion of increased size of the left chest lipoma and now 8 months after his previous followup CT for stage I adeno carcinoma of the lung we'll arrange for a followup CT scan following the CT scan I will see him back in the office X. one to 2 weeks      HPI    Review of  Systems  Respiratory: Negative for cough and shortness of breath.   Cardiovascular: Negative for chest pain, palpitations and leg swelling.       Objective:   Physical Exam        Assessment & Plan:

## 2011-02-21 ENCOUNTER — Ambulatory Visit (INDEPENDENT_AMBULATORY_CARE_PROVIDER_SITE_OTHER): Payer: Medicare Other | Admitting: Cardiothoracic Surgery

## 2011-02-21 ENCOUNTER — Ambulatory Visit: Payer: Medicare Other | Admitting: Cardiothoracic Surgery

## 2011-02-21 ENCOUNTER — Other Ambulatory Visit: Payer: Medicare Other

## 2011-02-21 ENCOUNTER — Ambulatory Visit
Admission: RE | Admit: 2011-02-21 | Discharge: 2011-02-21 | Disposition: A | Discharge status: Home / Self Care | Payer: Medicare Other | Source: Ambulatory Visit | Attending: Cardiothoracic Surgery | Admitting: Cardiothoracic Surgery

## 2011-02-21 ENCOUNTER — Encounter: Payer: Self-pay | Admitting: Cardiothoracic Surgery

## 2011-02-21 VITALS — BP 136/76 | HR 68 | Resp 20

## 2011-02-21 DIAGNOSIS — D179 Benign lipomatous neoplasm, unspecified: Secondary | ICD-10-CM

## 2011-02-21 DIAGNOSIS — C3490 Malignant neoplasm of unspecified part of unspecified bronchus or lung: Secondary | ICD-10-CM

## 2011-02-21 DIAGNOSIS — C3430 Malignant neoplasm of lower lobe, unspecified bronchus or lung: Secondary | ICD-10-CM

## 2011-02-21 DIAGNOSIS — Z09 Encounter for follow-up examination after completed treatment for conditions other than malignant neoplasm: Secondary | ICD-10-CM

## 2011-02-21 NOTE — Assessment & Plan Note (Unsigned)
OFFICE VISIT  HARUO, STEPANEK DOB:  February 06, 1935                                        February 21, 2011 CHART #:  [id number]  The patient returns to the office today in followup  DICTATION ENDS AT THIS POINT.  Sheliah Plane, MD   EG/MEDQ  D:  02/21/2011  T:  02/21/2011  Job:  [id number]

## 2011-02-21 NOTE — Progress Notes (Signed)
Brent Mcguire Date of Birth: July 18, 1935    Chief Complaint: Followup lung cancer History of Present Illness: Patient returns today with a CT scan of the chest to followup on radiographic findings from chest x-ray done last week he has a history of T1aN0M0  right lower lobe lung cancer resected December 2010. He also has known coronary occlusive disease with proximal circumflex disease being treated medically. In followup last week chest x-ray suggested increasing size of a left chest wall mass. A CT scan of the chest was performed today and the patient returns for followup discussion of the findings.   Past Medical History  Diagnosis Date  . Coronary artery disease   . Hypertension   . Thyroid disease   . UTI (lower urinary tract infection)   . Lung mass   . Foot pain   . Fatigue   . Kidney disease   . Cholelithiases   . Anemia   . Gastric ulcer   . Diabetes mellitus   . Hypercholesteremia   . Obesity   . GI bleed   . Gallstones   . Adenocarcinoma 07/08/2011    Right Lower Lobe of Lung    Past Surgical History  Procedure Date  . Elbow surgery     pilonidal cyst  . Cataract extraction   . Colonoscopy w/ polypectomy 2007    3 polyps  . Pilonidal cyst excision   . Electromagnetic navigation brochoscopy 06/28/2009    Dr. Tyrone Sage  . Video assisted thoracoscopy, mini thoracotomy,right lower lobectomy with lymph node dissection 06/28/2009    Dr. Tyrone Sage    History  Smoking status  . Never Smoker   Smokeless tobacco  . Not on file    History  Alcohol Use No    No Known Allergies  Current Outpatient Prescriptions on File Prior to Visit  Medication Sig Dispense Refill  . aspirin 81 MG tablet Take 81 mg by mouth daily.        . carvedilol (COREG) 6.25 MG tablet Take 6.25 mg by mouth 2 (two) times daily with a meal.        . clopidogrel (PLAVIX) 75 MG tablet TAKE 1 TABLET DAILY  90 tablet  2  . dexlansoprazole (DEXILANT) 60 MG capsule Take 60 mg by mouth  daily.        Marland Kitchen doxazosin (CARDURA) 8 MG tablet Take 8 mg by mouth at bedtime.        Marland Kitchen levothyroxine (LEVOTHROID) 137 MCG tablet Take 137 mcg by mouth daily.        . Multiple Vitamin (MULTIVITAMIN) tablet Take 1 tablet by mouth daily.        . Omega-3 Fatty Acids (FISH OIL) 1000 MG CAPS Take by mouth daily.        . pantoprazole (PROTONIX) 40 MG tablet       . simvastatin (ZOCOR) 40 MG tablet Take 40 mg by mouth at bedtime.        . triamterene-hydrochlorothiazide (MAXZIDE-25) 37.5-25 MG per tablet 1/2 tab by mouth daily       . TRIAMTERENE-HCTZ PO          No family history on file.  Review of Systems:  There are no changes in patient's review of systems Physical Exam: Patient is very pleasant and in no acute distress. Skin is warm and dry. Color is normal.  HEENT is unremarkable. Normocephalic/atraumatic. PERRL. Sclera are nonicteric. Neck is supple. No masses. No JVD. Lungs are clear. Cardiac exam shows a  regular rate and rhythm. Abdomen is soft. Extremities are without edema. Gait and ROM are intact. No gross neurologic deficits noted.      LABORATORY DATA: The CT of the chest done today is stable with no evidence of metastatic disease and no evidence in change of the left chest wall lipoma  Assessment / Plan: Patient is stable after resection of right lower lobe adenocarcinoma 07/08/2009 without evidence of recurrent CT scan done today. We'll plan to see the patient back in 6 months with PA and lateral chest x-ray

## 2011-04-04 ENCOUNTER — Other Ambulatory Visit: Payer: Self-pay | Admitting: Cardiovascular Disease

## 2011-04-24 ENCOUNTER — Ambulatory Visit (INDEPENDENT_AMBULATORY_CARE_PROVIDER_SITE_OTHER): Payer: Medicare Other | Admitting: Cardiovascular Disease

## 2011-04-24 ENCOUNTER — Encounter: Payer: Self-pay | Admitting: Cardiovascular Disease

## 2011-04-24 ENCOUNTER — Telehealth: Payer: Self-pay | Admitting: *Deleted

## 2011-04-24 DIAGNOSIS — I10 Essential (primary) hypertension: Secondary | ICD-10-CM

## 2011-04-24 DIAGNOSIS — R609 Edema, unspecified: Secondary | ICD-10-CM | POA: Insufficient documentation

## 2011-04-24 DIAGNOSIS — I2510 Atherosclerotic heart disease of native coronary artery without angina pectoris: Secondary | ICD-10-CM

## 2011-04-24 DIAGNOSIS — E7800 Pure hypercholesterolemia, unspecified: Secondary | ICD-10-CM

## 2011-04-24 MED ORDER — TRIAMTERENE-HCTZ 37.5-25 MG PO TABS: 1.0000 | ORAL_TABLET | Freq: Every day | ORAL | Status: DC

## 2011-04-24 MED ORDER — NITROGLYCERIN 0.4 MG SL SUBL: 0.4000 mg | SUBLINGUAL_TABLET | SUBLINGUAL | Status: DC | PRN

## 2011-04-24 NOTE — Assessment & Plan Note (Signed)
F/U labs with Shary Decamp.  Continue statin.

## 2011-04-24 NOTE — Progress Notes (Signed)
Brent Mcguire is seen today for F/U of CAD and dyspnea. He has a tight ostial circ lesion that is not amenable to PCI due to its proximity to the LM. He had succesful left thoracotomy in [place] by Dr Tyrone Sage the first week in December. The lung nodule was cancerous but it appears to be contained by surgery. He had some hemoroidal bleeding .    Ihas switched to Protonix for his antireflux med since hs is on Plavix.   He has not had any SSCP, palpitations, dyspnea syncope  Has LE edema that is dependant and from salt indiscretion and obesity.  Needs refill on nitro   Since I last saw him he had his gallbladder out in [place]. He had no cardiac complications. Since he has been on Plavix he has not had any angina. Since he does have a critical ostial circ lesion not amenable to PCI I prefer to keep him on Plavix to prevent MI. If his angina returns he may be a candidate for CABG now that his lung CA seems to be cured. Dr Tyrone Sage indicated he did not need adjuvant XRT  I encouraged him to try to lose weight. He was supposed to have a myovue today but did not want to do it due to cost.    ROS: Denies fever, malais, weight loss, blurry vision, decreased visual acuity, cough, sputum, SOB, hemoptysis, pleuritic pain, palpitaitons, heartburn, abdominal pain, melena, l, claudication, or rash.  All other systems reviewed and negative  General: Affect appropriate Healthy:  appears stated age HEENT: normal Neck supple with no adenopathy JVP normal no bruits no thyromegaly Lungs clear with no wheezing and good diaphragmatic motion S/P left thoracotomy Heart:  S1/S2 no murmur,rub, gallop or click PMI normal Abdomen: benighn, BS positve, no tenderness, no AAA S/P choly no bruit.  No HSM or HJR Distal pulses intact with no bruits Plus one bilateral edema Neuro non-focal Skin warm and dry No muscular weakness   Current Outpatient Prescriptions  Medication Sig Dispense Refill  . aspirin 81 MG tablet Take 81  mg by mouth daily.        . carvedilol (COREG) 6.25 MG tablet TAKE 1 TABLET TWICE A DAY  180 tablet  2  . clopidogrel (PLAVIX) 75 MG tablet TAKE 1 TABLET DAILY  90 tablet  2  . dexlansoprazole (DEXILANT) 60 MG capsule Take 60 mg by mouth daily.        Marland Kitchen doxazosin (CARDURA) 8 MG tablet Take 8 mg by mouth at bedtime.        Marland Kitchen levothyroxine (LEVOTHROID) 137 MCG tablet Take 137 mcg by mouth daily.        . Multiple Vitamin (MULTIVITAMIN) tablet Take 1 tablet by mouth daily.        . Omega-3 Fatty Acids (FISH OIL) 1000 MG CAPS Take by mouth daily.        . pantoprazole (PROTONIX) 40 MG tablet       . simvastatin (ZOCOR) 40 MG tablet Take 40 mg by mouth at bedtime.        . triamterene-hydrochlorothiazide (MAXZIDE-25) 37.5-25 MG per tablet 1/2 tab by mouth daily         Allergies  Review of patient's allergies indicates no known allergies.  Electrocardiogram:    Assessment and Plan

## 2011-04-24 NOTE — Patient Instructions (Signed)
Your physician wants you to follow-up in:  6 MONTHS WITH DR NISHAN  You will receive a reminder letter in the mail two months in advance. If you don't receive a letter, please call our office to schedule the follow-up appointment. Your physician recommends that you continue on your current medications as directed. Please refer to the Current Medication list given to you today. 

## 2011-04-24 NOTE — Assessment & Plan Note (Signed)
Dependant.  Continue current dose of diuretic.  Low sodium diet needs to be improved

## 2011-04-24 NOTE — Assessment & Plan Note (Signed)
Well controlled.  Continue current medications and low sodium Dash type diet.    

## 2011-04-24 NOTE — Telephone Encounter (Signed)
NEED TO MAKE SURE PT NOT TAKING DEXILANT AND  NEXIUM ALSO  THOUGHT PT WAS ON TRIAMTEREN/HCTZ 37. 5  /25 MG DR Eden Emms WISHES TO START HCTZ 12.5 MG IF NOT

## 2011-04-24 NOTE — Assessment & Plan Note (Signed)
Stable no angina.  Refusing myovue due to cost

## 2011-04-25 MED ORDER — NITROGLYCERIN 0.4 MG SL SUBL: 0.4000 mg | SUBLINGUAL_TABLET | SUBLINGUAL | Status: AC | PRN

## 2011-04-25 NOTE — Telephone Encounter (Signed)
PT IS NOT TAKING  DEXILANT AND IS ON TRIAM /HCTZ 37.5 /25 MG  .Brent Mcguire

## 2011-04-25 NOTE — Telephone Encounter (Signed)
LMTCB ./CY 

## 2011-04-25 NOTE — Telephone Encounter (Signed)
Pt calling stating that RX that we called in yesterday the "[id number]". Pharmacy wants to know if we have replacement for it. Please return pt call to discuss further. Also please call pharmacy to discuss possible substitutes.

## 2011-07-08 DIAGNOSIS — C801 Malignant (primary) neoplasm, unspecified: Secondary | ICD-10-CM

## 2011-07-08 HISTORY — DX: Malignant (primary) neoplasm, unspecified: C80.1

## 2011-08-28 ENCOUNTER — Other Ambulatory Visit: Payer: Self-pay | Admitting: Cardiothoracic Surgery

## 2011-08-28 DIAGNOSIS — C3490 Malignant neoplasm of unspecified part of unspecified bronchus or lung: Secondary | ICD-10-CM

## 2011-08-29 ENCOUNTER — Ambulatory Visit
Admission: RE | Admit: 2011-08-29 | Discharge: 2011-08-29 | Disposition: A | Discharge status: Home / Self Care | Payer: Medicare Other | Source: Ambulatory Visit | Attending: Cardiothoracic Surgery | Admitting: Cardiothoracic Surgery

## 2011-08-29 ENCOUNTER — Encounter: Payer: Self-pay | Admitting: Cardiothoracic Surgery

## 2011-08-29 ENCOUNTER — Ambulatory Visit (INDEPENDENT_AMBULATORY_CARE_PROVIDER_SITE_OTHER): Payer: Medicare Other | Admitting: Cardiothoracic Surgery

## 2011-08-29 VITALS — BP 119/77 | HR 66 | Resp 20 | Ht 74.0 in | Wt 276.0 lb

## 2011-08-29 DIAGNOSIS — C3490 Malignant neoplasm of unspecified part of unspecified bronchus or lung: Secondary | ICD-10-CM

## 2011-08-29 DIAGNOSIS — Z85118 Personal history of other malignant neoplasm of bronchus and lung: Secondary | ICD-10-CM

## 2011-08-29 NOTE — Patient Instructions (Signed)
Chest xray ok today Follow up ct of chest July 2013

## 2011-08-29 NOTE — Progress Notes (Signed)
[address]            [place] [address]          [phone]      Brent Mcguire [facility] Medical Record #[id number] Date of Birth: Oct 02, 1934  Referring: Brent Stade, MD Primary Care: No primary provider on file.  Chief Complaint:   POST OP FOLLOW UP Lung Resection   History of Present Illness:     Mr. 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 Since his resection the patient's had no symptoms involving the lungs. Several months postop he did require cholecystectomy. He does have known coronary occlusive disease treated medically. He notes that he has been on weight loss diet and has lost from 311-276 pounds. He currently exercises every morning. He denies any chest discomfort. He notes that he is up-to-date on pneumococcal and flu vaccination      Past Medical History  Diagnosis Date  . Coronary artery disease   . Hypertension   . Thyroid disease   . UTI (lower urinary tract infection)   . Lung mass   . Foot pain   . Fatigue   . Kidney disease   . Cholelithiases   . Anemia   . Gastric ulcer   . Diabetes mellitus   . Hypercholesteremia   . Obesity   . GI bleed   . Gallstones   . Adenocarcinoma 07/08/2011    Right Lower Lobe of Lung     History  Smoking status  . Former Smoker  . Types: Cigarettes  . Quit date: 07/29/1993  Smokeless tobacco  . Never Used    History  Alcohol Use No     No Known Allergies  Current Outpatient Prescriptions  Medication Sig Dispense Refill  . aspirin 81 MG tablet Take 81 mg by mouth daily.        . carvedilol (COREG) 6.25 MG tablet TAKE 1 TABLET TWICE A DAY  180 tablet  2  . clopidogrel (PLAVIX) 75 MG tablet TAKE 1 TABLET DAILY  90 tablet  2  . doxazosin (CARDURA) 8 MG tablet Take 8 mg by mouth at bedtime.        Marland Kitchen levothyroxine (LEVOTHROID) 137 MCG tablet Take 137 mcg by mouth  daily.        . Multiple Vitamin (MULTIVITAMIN) tablet Take 1 tablet by mouth daily.        . nitroGLYCERIN (NITROSTAT) 0.4 MG SL tablet Place 1 tablet (0.4 mg total) under the tongue every 5 (five) minutes as needed for chest pain.  90 tablet  1  . Omega-3 Fatty Acids (FISH OIL) 1000 MG CAPS Take by mouth daily.        . pantoprazole (PROTONIX) 40 MG tablet       . simvastatin (ZOCOR) 40 MG tablet Take 40 mg by mouth at bedtime.        . triamterene-hydrochlorothiazide (MAXZIDE-25) 37.5-25 MG per tablet Take 1 each (1 tablet total) by mouth daily.           Physical Exam: BP 119/77  Pulse 66  Resp 20  Ht 6\' 2"  (1.88 m)  Wt 276 lb (125.193 kg)  BMI 35.44 kg/m2  SpO2 94%  General appearance: alert, cooperative, appears stated age and no distress Neurologic: intact Heart: regular rate  and rhythm, S1, S2 normal, no murmur, click, rub or gallop Lungs: clear to auscultation bilaterally Abdomen: soft, non-tender; bowel sounds normal; no masses,  no organomegaly Extremities: extremities normal, atraumatic, no cyanosis or edema, Homans sign is negative, no sign of DVT and no edema, redness or tenderness in the calves or thighs I do not appreciate any cervical or supra-clavicular or axillary adenopathy The right chest incisions are well-healed   Diagnostic Studies & Laboratory data:     Recent Radiology Findings:   Dg Chest 2 View  08/29/2011  *RADIOLOGY REPORT*  Clinical Data: History of lung cancer.  CHEST - 2 VIEW  Comparison: Radiographs 02/07/2011 and CT 02/21/2011.  Findings: Overall respiratory effort is improved.  There is stable volume loss and pleural thickening on the right status post partial thoracotomy.  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  The lungs are clear.  There is no pleural effusion or pneumothorax. Heart size and mediastinal contours are stable.  IMPRESSION: Stable postoperative chest.  Original Report Authenticated By: Brent Mcguire, M.D.    The  patient had a CT scan of the chest without evidence of recurrence in July 2011  Recent Lab Findings: Lab Results  Component Value Date   WBC 7.4 06/13/2009   HGB 14.6 06/13/2009   HCT 44.3 06/13/2009   PLT 163.0 06/13/2009   GLUCOSE 106* 06/14/2009   NA 142 06/13/2009   K 4.6 06/13/2009   CL 106 06/13/2009   CREATININE 1.5 06/13/2009   BUN 21 06/13/2009   CO2 29 06/13/2009   INR 1.1 ratio* 06/13/2009      Assessment / Plan:    Overall the patient is making very good progress, he is very proud that he has been able to lose weight and exercise daily On chest x-ray I see no evidence of recurrent carcinoma of the lung. We will Plan to see him back in 6-8 months with a followup CT scan of the chest   Brent Ovens MD  Beeper [phone] Office [phone] 08/29/2011 9:53 AM

## 2011-10-23 ENCOUNTER — Other Ambulatory Visit: Payer: Self-pay | Admitting: Cardiovascular Disease

## 2011-10-23 MED ORDER — CLOPIDOGREL BISULFATE 75 MG PO TABS: 75.0000 mg | ORAL_TABLET | Freq: Every day | ORAL | Status: DC

## 2011-10-23 MED ORDER — CARVEDILOL 6.25 MG PO TABS: 6.2500 mg | ORAL_TABLET | Freq: Two times a day (BID) | ORAL | Status: DC

## 2011-10-23 NOTE — Telephone Encounter (Signed)
Refill-   Carvedilol (COREG) 6.25 MG tablet         -   Clopidogrel (PLAVIX) 75 MG tablet   Patient verified pharmacy as [facility], he can be reached at [phone] for additional info.

## 2011-11-22 ENCOUNTER — Ambulatory Visit (INDEPENDENT_AMBULATORY_CARE_PROVIDER_SITE_OTHER): Payer: Medicare Other | Admitting: Cardiovascular Disease

## 2011-11-22 ENCOUNTER — Encounter: Payer: Self-pay | Admitting: Cardiovascular Disease

## 2011-11-22 VITALS — BP 119/71 | HR 68 | Ht 73.0 in | Wt 256.0 lb

## 2011-11-22 DIAGNOSIS — I2510 Atherosclerotic heart disease of native coronary artery without angina pectoris: Secondary | ICD-10-CM

## 2011-11-22 DIAGNOSIS — R222 Localized swelling, mass and lump, trunk: Secondary | ICD-10-CM

## 2011-11-22 DIAGNOSIS — I10 Essential (primary) hypertension: Secondary | ICD-10-CM

## 2011-11-22 DIAGNOSIS — R079 Chest pain, unspecified: Secondary | ICD-10-CM

## 2011-11-22 NOTE — Assessment & Plan Note (Signed)
Exertional dyspnea with known proximal circumflex disease.  Lexiscan myovue to assess ischemic burden on medical Rx.  Continue asa, plavix and coreg.  Since his cancer is "cured" a high ischemic burden in setting of lesion not amenable to stenting may warrant CABG

## 2011-11-22 NOTE — Assessment & Plan Note (Signed)
Well controlled.  Continue current medications and low sodium Dash type diet.    

## 2011-11-22 NOTE — Assessment & Plan Note (Signed)
Discussed low carb diet.  Target hemoglobin A1c is 6.5 or less.  Continue current medications.  

## 2011-11-22 NOTE — Assessment & Plan Note (Signed)
S/P thoracotomy with lung CA No adjuvant XRT needed

## 2011-11-22 NOTE — Progress Notes (Signed)
Patient ID: Brent Mcguire, male   DOB: September 16, 1934, 77 y.o.   MRN: [id number] Brent Mcguire is seen today for F/U of CAD and dyspnea. He has a tight ostial circ lesion that is not amenable to PCI due to its proximity to the LM. He had succesful left thoracotomy in [place] by Dr Brent Mcguire the first week in December 2011 . The lung nodule was cancerous but it appears to be contained by surgery. He had some hemoroidal bleeding . Ihas switched to Protonix for his antireflux med since hs is on Plavix. He has not had any SSCP, palpitations,  syncope Has LE edema that is dependant and from salt indiscretion and obesity. Needs refill on nitro  Has lost 50 lbs Exercising at [facility].  Some exertional dyspnea but activity limited by knee and hip pain.  No history of claudication   ROS: Denies fever, malais, weight loss, blurry vision, decreased visual acuity, cough, sputum, SOB, hemoptysis, pleuritic pain, palpitaitons, heartburn, abdominal pain, melena, lower extremity edema, claudication, or rash.  All other systems reviewed and negative  General: Affect appropriate Healthy:  appears stated age HEENT: normal Neck supple with no adenopathy JVP normal no bruits no thyromegaly Lungs clear with no wheezing and good diaphragmatic motion Heart:  S1/S2 no murmur, no rub, gallop or click PMI normal Abdomen: benighn, BS positve, no tenderness, no AAA no bruit.  No HSM or HJR Distal pulses intact with no bruits No edema Neuro non-focal Skin warm and dry No muscular weakness   Current Outpatient Prescriptions  Medication Sig Dispense Refill  . aspirin 81 MG tablet Take 81 mg by mouth daily.        . carvedilol (COREG) 6.25 MG tablet Take 1 tablet (6.25 mg total) by mouth 2 (two) times daily with a meal.  180 tablet  1  . clopidogrel (PLAVIX) 75 MG tablet Take 1 tablet (75 mg total) by mouth daily.  90 tablet  1  . doxazosin (CARDURA) 8 MG tablet Take 8 mg by mouth at bedtime.        Marland Kitchen levothyroxine (LEVOTHROID) 137  MCG tablet Take 137 mcg by mouth daily.        . Multiple Vitamin (MULTIVITAMIN) tablet Take 1 tablet by mouth daily.        . nitroGLYCERIN (NITROSTAT) 0.4 MG SL tablet Place 1 tablet (0.4 mg total) under the tongue every 5 (five) minutes as needed for chest pain.  90 tablet  1  . pantoprazole (PROTONIX) 40 MG tablet Take 40 mg by mouth daily.       . simvastatin (ZOCOR) 40 MG tablet Take 40 mg by mouth at bedtime.        . triamterene-hydrochlorothiazide (MAXZIDE-25) 37.5-25 MG per tablet Take 1 each (1 tablet total) by mouth daily.        Allergies  Review of patient's allergies indicates no known allergies.  Electrocardiogram:  NSR rate 66 normal  Assessment and Plan

## 2011-11-22 NOTE — Patient Instructions (Signed)
Your physician wants you to follow-up in: 6 months with DR Haywood Filler will receive a reminder letter in the mail two months in advance. If you don't receive a letter, please call our office to schedule the follow-up appointment. Your physician recommends that you continue on your current medications as directed. Please refer to the Current Medication list given to you today. Your physician has requested that you have a lexiscan myoview. For further information please visit [email or web address] Please follow instruction sheet, as given. DX CHEST PAIN

## 2011-12-02 ENCOUNTER — Other Ambulatory Visit (HOSPITAL_COMMUNITY): Payer: Medicare Other

## 2012-01-09 ENCOUNTER — Other Ambulatory Visit (HOSPITAL_COMMUNITY): Payer: Self-pay | Admitting: *Deleted

## 2012-01-09 MED ORDER — CARVEDILOL 6.25 MG PO TABS: 6.2500 mg | ORAL_TABLET | Freq: Two times a day (BID) | ORAL | Status: DC

## 2012-01-09 MED ORDER — CLOPIDOGREL BISULFATE 75 MG PO TABS: 75.0000 mg | ORAL_TABLET | Freq: Every day | ORAL | Status: DC

## 2012-01-09 NOTE — Telephone Encounter (Signed)
Refilled carvedilol and clopidrogrel

## 2012-02-11 ENCOUNTER — Other Ambulatory Visit: Payer: Self-pay | Admitting: Cardiothoracic Surgery

## 2012-02-11 DIAGNOSIS — C3490 Malignant neoplasm of unspecified part of unspecified bronchus or lung: Secondary | ICD-10-CM

## 2012-04-02 ENCOUNTER — Ambulatory Visit
Admission: RE | Admit: 2012-04-02 | Discharge: 2012-04-02 | Disposition: A | Discharge status: Home / Self Care | Payer: Medicare Other | Source: Ambulatory Visit | Attending: Cardiothoracic Surgery | Admitting: Cardiothoracic Surgery

## 2012-04-02 ENCOUNTER — Ambulatory Visit (INDEPENDENT_AMBULATORY_CARE_PROVIDER_SITE_OTHER): Payer: Medicare Other | Admitting: Cardiothoracic Surgery

## 2012-04-02 ENCOUNTER — Encounter: Payer: Self-pay | Admitting: Cardiothoracic Surgery

## 2012-04-02 VITALS — BP 108/64 | HR 87 | Resp 20 | Ht 74.0 in | Wt 259.0 lb

## 2012-04-02 DIAGNOSIS — C3430 Malignant neoplasm of lower lobe, unspecified bronchus or lung: Secondary | ICD-10-CM

## 2012-04-02 DIAGNOSIS — C3490 Malignant neoplasm of unspecified part of unspecified bronchus or lung: Secondary | ICD-10-CM

## 2012-04-02 DIAGNOSIS — Z09 Encounter for follow-up examination after completed treatment for conditions other than malignant neoplasm: Secondary | ICD-10-CM

## 2012-04-02 NOTE — Progress Notes (Signed)
[address]            [place] [address]          [phone]       Brent Mcguire [facility] Medical Record #[id number] Date of Birth: Mar 15, 1935  Referring: Gordan Payment., MD Primary Care: No primary provider on file.  Chief Complaint:   POST OP FOLLOW UP Lung Resection   History of Present Illness:     Mr. 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 Since his resection the patient's had no symptoms involving the lungs.  postop he did  He does have known coronary occlusive disease treated medically. He notes that he has been on weight loss diet and has lost from 311- 259 pounds. He currently exercises every morning. He denies any chest discomfort. He notes that he is up-to-date on pneumococcal and flu vaccination. Exercises daily      Past Medical History  Diagnosis Date  . Coronary artery disease   . Hypertension   . Thyroid disease   . UTI (lower urinary tract infection)   . Lung mass   . Foot pain   . Fatigue   . Kidney disease   . Cholelithiases   . Anemia   . Gastric ulcer   . Diabetes mellitus   . Hypercholesteremia   . Obesity   . GI bleed   . Gallstones   . Adenocarcinoma 07/08/2011    Right Lower Lobe of Lung     History  Smoking status  . Former Smoker  . Types: Cigarettes  . Quit date: 07/29/1993  Smokeless tobacco  . Never Used    History  Alcohol Use No     No Known Allergies  Current Outpatient Prescriptions  Medication Sig Dispense Refill  . aspirin 81 MG tablet Take 81 mg by mouth daily.        . carvedilol (COREG) 6.25 MG tablet Take 1 tablet (6.25 mg total) by mouth 2 (two) times daily with a meal.  180 tablet  3  . clopidogrel (PLAVIX) 75 MG tablet Take 1 tablet (75 mg total) by mouth daily.  90 tablet  3  . doxazosin (CARDURA) 8 MG tablet Take 8 mg by mouth at bedtime.         Marland Kitchen levothyroxine (LEVOTHROID) 137 MCG tablet Take 137 mcg by mouth daily.        . Multiple Vitamin (MULTIVITAMIN) tablet Take 1 tablet by mouth daily.        . nitroGLYCERIN (NITROSTAT) 0.4 MG SL tablet Place 1 tablet (0.4 mg total) under the tongue every 5 (five) minutes as needed for chest pain.  90 tablet  1  . pantoprazole (PROTONIX) 40 MG tablet Take 40 mg by mouth daily.       . simvastatin (ZOCOR) 40 MG tablet Take 40 mg by mouth at bedtime.        . triamterene-hydrochlorothiazide (MAXZIDE-25) 37.5-25 MG per tablet Take 1 each (1 tablet total) by mouth daily.           Physical Exam: BP 108/64  Pulse 87  Resp 20  Ht 6\' 2"  (1.88 m)  Wt 259 lb (117.482 kg)  BMI 33.25 kg/m2  SpO2 93%  General appearance: alert, cooperative, appears stated age and no distress Neurologic: intact Heart: regular rate and rhythm, S1, S2 normal, no murmur, click, rub or gallop Lungs: clear to auscultation bilaterally Abdomen: soft, non-tender; bowel sounds normal; no masses,  no organomegaly Extremities: extremities normal, atraumatic, no cyanosis or edema, Homans sign is negative, no sign of DVT and no edema, redness or tenderness in the calves or thighs I do not appreciate any cervical or supra-clavicular or axillary adenopathy, no palpable abdominal aneurysm  The right chest incisions are well-healed   Diagnostic Studies & Laboratory data:     Recent Radiology Findings:   Ct Chest Wo Contrast  04/02/2012  *RADIOLOGY REPORT*  Clinical Data: Right lung cancer status post resection in 06/2009  CT CHEST WITHOUT CONTRAST  Technique:  Multidetector CT imaging of the chest was performed following the standard protocol without IV contrast.  Comparison: Multiple priors, most recently 02/21/2011  Findings: Prior right lower lobe resection with associated postsurgical changes.  No new/suspicious pulmonary nodules.  Stable left pleural lipoma.  No pleural effusion or pneumothorax.  Visualized thyroid is  unremarkable.  The heart is normal in size.  Coronary atherosclerosis. Atherosclerotic calcifications of the aortic arch.  Small mediastinal lymph nodes which do not meet pathologic CT size criteria.  No suspicious axillary lymphadenopathy.  Apparent low density in the left main pulmonary artery is artifactual (series 3/image 26).  Visualized upper abdomen is notable for cholecystectomy clips.  Degenerative changes of the visualized thoracolumbar spine.  IMPRESSION: Postsurgical changes related to prior right lower lobe resection.  No evidence of recurrent or metastatic disease in the chest.   Original Report Authenticated By: Charline Bills, M.D.       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 Will follow up with low dose cy of chest one year  Delight Ovens MD  Beeper [phone] Office [phone] 04/02/2012 1:08 PM

## 2012-04-02 NOTE — Patient Instructions (Signed)
Return for ct scan of chest in one year

## 2012-09-26 DEATH — deceased

## 2013-03-25 ENCOUNTER — Other Ambulatory Visit: Payer: Self-pay | Admitting: *Deleted

## 2013-04-29 ENCOUNTER — Other Ambulatory Visit: Payer: Self-pay | Admitting: Cardiothoracic Surgery

## 2013-04-29 ENCOUNTER — Other Ambulatory Visit: Payer: Self-pay | Admitting: *Deleted

## 2013-04-29 ENCOUNTER — Ambulatory Visit
Admission: RE | Admit: 2013-04-29 | Discharge: 2013-04-29 | Disposition: A | Discharge status: Home / Self Care | Payer: Medicare Other | Source: Ambulatory Visit | Attending: Cardiothoracic Surgery | Admitting: Cardiothoracic Surgery

## 2013-04-29 ENCOUNTER — Ambulatory Visit (INDEPENDENT_AMBULATORY_CARE_PROVIDER_SITE_OTHER): Payer: Medicare Other | Admitting: Cardiothoracic Surgery

## 2013-04-29 ENCOUNTER — Encounter: Payer: Self-pay | Admitting: Cardiothoracic Surgery

## 2013-04-29 VITALS — BP 132/80 | HR 100 | Resp 20 | Ht 73.0 in | Wt 265.0 lb

## 2013-04-29 DIAGNOSIS — Z85118 Personal history of other malignant neoplasm of bronchus and lung: Secondary | ICD-10-CM

## 2013-04-29 NOTE — Progress Notes (Signed)
[address]       [place] [address]             [phone]                    KAMRAN COKER [facility] Medical Record #[id number] Date of Birth: July 02, 1935  Referring: Gordan Payment., MD Primary Care: Feliciana Rossetti, MD  Chief Complaint:   POST OP FOLLOW UP Lung Resection   History of Present Illness:     Mr. Henery returns to the office today in followup after resection of a right lower lobe adenocarcinoma the lung T1aN0M0 (stage 1)resected 06/28/2009. Since his resection the patient's had no symptoms involving the lungs.    He does have known coronary occlusive disease treated medically. He notes that he has been on weight loss diet and has lost from 311- 259 pounds. He currently exercises every morning. He denies any chest discomfort. He notes that he is up-to-date on pneumococcal and flu vaccination. Exercises daily      Past Medical History  Diagnosis Date  . Coronary artery disease   . Hypertension   . Thyroid disease   . UTI (lower urinary tract infection)   . Lung mass   . Foot pain   . Fatigue   . Kidney disease   . Cholelithiases   . Anemia   . Gastric ulcer   . Diabetes mellitus   . Hypercholesteremia   . Obesity   . GI bleed   . Gallstones   . Adenocarcinoma 07/08/2011    Right Lower Lobe of Lung     History  Smoking status  . Former Smoker  . Types: Cigarettes  . Quit date: 07/29/1993  Smokeless tobacco  . Never Used    History  Alcohol Use No     No Known Allergies  Current Outpatient Prescriptions  Medication Sig Dispense Refill  . aspirin 81 MG tablet Take 81 mg by mouth daily.        . carvedilol (COREG) 6.25 MG tablet Take 1 tablet (6.25 mg total) by mouth 2 (two) times daily with a meal.  180 tablet  3  . clopidogrel (PLAVIX) 75 MG tablet Take 1 tablet (75 mg total) by mouth daily.  90 tablet  3  . doxazosin (CARDURA) 8 MG tablet Take 8 mg by mouth at bedtime.        Marland Kitchen levothyroxine (LEVOTHROID) 137 MCG  tablet Take 137 mcg by mouth daily.        . Multiple Vitamin (MULTIVITAMIN) tablet Take 1 tablet by mouth daily.        . pantoprazole (PROTONIX) 40 MG tablet Take 40 mg by mouth daily.       . simvastatin (ZOCOR) 40 MG tablet Take 40 mg by mouth at bedtime.        . triamterene-hydrochlorothiazide (MAXZIDE-25) 37.5-25 MG per tablet Take 1 each (1 tablet total) by mouth daily.       No current facility-administered medications for this visit.       Physical Exam: BP 132/80  Pulse 100  Resp 20  Ht 6\' 1"  (1.854 m)  Wt 265 lb (120.203 kg)  BMI 34.97 kg/m2  SpO2 96%  General appearance: alert, cooperative, appears stated age and no distress Neurologic: intact Heart: regular rate and rhythm, S1, S2 normal, no murmur, click, rub or gallop Lungs: clear to auscultation bilaterally Abdomen: soft, non-tender; bowel sounds normal; no masses,  no  organomegaly Extremities: extremities normal, atraumatic, no cyanosis or edema, Homans sign is negative, no sign of DVT and no edema, redness or tenderness in the calves or thighs I do not appreciate any cervical or supra-clavicular or axillary adenopathy, no palpable abdominal aneurysm  The right chest incisions are well-healed   Diagnostic Studies & Laboratory data:     Recent Radiology Findings:   Ct Chest Low Dose Pilot W/o Cm  04/29/2013   *RADIOLOGY REPORT*  Clinical Data: Follow-up lung cancer.  CT CHEST LOW DOSE PILOT WITHOUT CONTRAST  Technique: Multidetector CT imaging of the chest using the standard low-dose protocol without administration of intravenous contrast.  Comparison: Chest CT 04/02/2012  Findings: Low dose technique limits evaluation.  Visualized neck base is unremarkable.  No enlarged axillary, mediastinal or hilar lymphadenopathy.  Normal heart size.  Coronary artery calcifications.  No pericardial effusion.  Large hiatal hernia.  Central airways are patent.  Minimal ground-glass opacity within the dependent left lower lobe.   Unchanged 7 mm ground-glass opacity within the left upper lobe (image 34; series 3).  Postsurgical change compatible with right lower lobe resection. Unchanged peripheral scarring / postsurgical change within the residual right lung.  No pleural effusion pneumothorax.  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  Incidental imaging of the upper abdomen demonstrates post cholecystectomy changes.  Normal bilateral adrenal glands.  No aggressive or acute appearing osseous lesions.  IMPRESSION: 1.  Postsurgical change related to prior right lower lobe resection.  No evidence for recurrent or metastatic disease. 2.  Unchanged small ground-glass nodule within the left upper lobe. Attention on follow-up. 3.  Interval development of patchy ground-glass opacities within the left lower lobe which likely represent atelectasis.  Aspiration or infection are considered less likely.   Original Report Authenticated By: Annia Belt, M.D      Recent Lab Findings: Lab Results  Component Value Date   WBC 7.4 06/13/2009   HGB 14.6 06/13/2009   HCT 44.3 06/13/2009   PLT 163.0 06/13/2009   GLUCOSE 106* 06/14/2009   NA 142 06/13/2009   K 4.6 06/13/2009   CL 106 06/13/2009   CREATININE 1.5 06/13/2009   BUN 21 06/13/2009   CO2 29 06/13/2009   INR 1.1 ratio* 06/13/2009      Assessment / Plan:   Overall the patient is making very good progress, he is very proud that he has been able to lose weight and exercise daily On chest ct  I see no evidence of recurrent carcinoma of the lung, since resection of right lower lobe adenocarcinoma the lung T1aN0M0 (stage 1)resected 06/28/2009, now almost 4 years Will follow up with low dose cy of chest one year  Delight Ovens MD  Beeper [phone] Office [phone] 04/29/2013 5:46 PM

## 2014-05-06 ENCOUNTER — Other Ambulatory Visit: Payer: Self-pay | Admitting: *Deleted

## 2014-05-06 DIAGNOSIS — R222 Localized swelling, mass and lump, trunk: Secondary | ICD-10-CM

## 2014-06-09 ENCOUNTER — Ambulatory Visit (INDEPENDENT_AMBULATORY_CARE_PROVIDER_SITE_OTHER): Payer: Commercial Managed Care - HMO | Admitting: Cardiothoracic Surgery

## 2014-06-09 ENCOUNTER — Encounter: Payer: Self-pay | Admitting: Cardiothoracic Surgery

## 2014-06-09 ENCOUNTER — Ambulatory Visit
Admission: RE | Admit: 2014-06-09 | Discharge: 2014-06-09 | Disposition: A | Discharge status: Home / Self Care | Payer: Commercial Managed Care - HMO | Source: Ambulatory Visit | Attending: Cardiothoracic Surgery | Admitting: Cardiothoracic Surgery

## 2014-06-09 VITALS — BP 146/94 | HR 92 | Resp 20 | Ht 73.0 in | Wt 288.0 lb

## 2014-06-09 DIAGNOSIS — E669 Obesity, unspecified: Secondary | ICD-10-CM

## 2014-06-09 DIAGNOSIS — C3430 Malignant neoplasm of lower lobe, unspecified bronchus or lung: Secondary | ICD-10-CM | POA: Insufficient documentation

## 2014-06-09 DIAGNOSIS — R222 Localized swelling, mass and lump, trunk: Secondary | ICD-10-CM

## 2014-06-09 DIAGNOSIS — Z85118 Personal history of other malignant neoplasm of bronchus and lung: Secondary | ICD-10-CM

## 2014-06-09 DIAGNOSIS — C3431 Malignant neoplasm of lower lobe, right bronchus or lung: Secondary | ICD-10-CM | POA: Diagnosis not present

## 2014-06-09 NOTE — Progress Notes (Signed)
[address]       [place] [address]             [phone]                    Jimi F Sacco [place] Medical Record #[id number] Date of Birth: 04/10/1935  Referring: Josue Hector, MD Primary Care: Gilford Rile, MD  Chief Complaint:   POST OP FOLLOW UP Lung Resection   History of Present Illness:     Mr. Levan returns to the office today in followup after resection of a right lower lobe adenocarcinoma the lung T1aN0M0 (stage 1)resected 06/28/2009. Since his resection the patient's had no symptoms involving the lungs.    He does have known coronary occlusive disease treated medically. He notes he continues to exercise each morning, but in spite of this has gained weight over the past year. He denies any angina. His exertional shortness of breath is unchanged.     Past Medical History  Diagnosis Date  . Coronary artery disease   . Hypertension   . Thyroid disease   . UTI (lower urinary tract infection)   . Lung mass   . Foot pain   . Fatigue   . Kidney disease   . Cholelithiases   . Anemia   . Gastric ulcer   . Diabetes mellitus   . Hypercholesteremia   . Obesity   . GI bleed   . Gallstones   . Adenocarcinoma 07/08/2011    Right Lower Lobe of Lung     History  Smoking status  . Former Smoker  . Types: Cigarettes  . Quit date: 07/29/1993  Smokeless tobacco  . Never Used    History  Alcohol Use No     No Known Allergies  Current Outpatient Prescriptions  Medication Sig Dispense Refill  . aspirin 81 MG tablet Take 81 mg by mouth daily.      . carvedilol (COREG) 6.25 MG tablet Take 1 tablet (6.25 mg total) by mouth 2 (two) times daily with a meal. 180 tablet 3  . clopidogrel (PLAVIX) 75 MG tablet Take 1 tablet (75 mg total) by mouth daily. 90 tablet 3  . doxazosin (CARDURA) 8 MG tablet Take 8 mg by mouth at bedtime.      Marland Kitchen levothyroxine (LEVOTHROID) 137 MCG tablet Take 137 mcg by mouth daily.      . Multiple Vitamin  (MULTIVITAMIN) tablet Take 1 tablet by mouth daily.      . pantoprazole (PROTONIX) 40 MG tablet Take 40 mg by mouth daily.     . simvastatin (ZOCOR) 40 MG tablet Take 40 mg by mouth at bedtime.      . triamterene-hydrochlorothiazide (MAXZIDE-25) 37.5-25 MG per tablet Take 1 each (1 tablet total) by mouth daily.     No current facility-administered medications for this visit.       Physical Exam: BP 146/94 mmHg  Pulse 92  Resp 20  Ht 6\' 1"  (1.854 m)  Wt 288 lb (130.636 kg)  BMI 38.01 kg/m2  SpO2 97%  General appearance: alert, cooperative, appears stated age and no distress Neurologic: intact Heart: regular rate and rhythm, S1, S2 normal, no murmur, click, rub or gallop Lungs: clear to auscultation bilaterally Abdomen: soft, non-tender; bowel sounds normal; no masses,  no organomegaly Extremities: extremities normal, atraumatic, no cyanosis or edema, Homans sign is negative, no sign of DVT and no edema, redness or tenderness in the calves or  thighs I do not appreciate any cervical or supra-clavicular or axillary adenopathy, no palpable abdominal aneurysm  The right chest incisions are well-healed   Diagnostic Studies & Laboratory data:     Recent Radiology Findings: Ct Chest Wo Contrast  06/09/2014   CLINICAL DATA:  Lung cancer.  EXAM: CT CHEST WITHOUT CONTRAST  TECHNIQUE: Multidetector CT imaging of the chest was performed following the standard protocol without IV contrast.  COMPARISON:  04/29/2013 and 04/02/2012.  FINDINGS: No pathologically enlarged mediastinal or axillary lymph nodes. Hilar regions are difficult to definitively evaluate without IV contrast. Atherosclerotic calcification of the arterial vasculature, including three-vessel involvement of the coronary arteries. Heart size normal. No pericardial effusion. Small hiatal hernia.  Postoperative changes of right lower lobectomy with associated scarring and volume loss in the right hemi thorax, stable. Sub cm  ground-glass nodular densities in the apical left upper lobe measure up to 7 mm, stable. Extrapleural fat proliferation or lipoma along the anterolateral margin of the left hemi thorax. No pleural fluid. Airway is otherwise unremarkable.  Incidental imaging of the upper abdomen shows the visualized portions of the liver to be grossly unremarkable. Cholecystectomy. Visualized portions of the adrenal glands and right kidney are unremarkable. Low-attenuation lesion in the upper pole left kidney measures 2.2 cm, likely stable from 04/02/2012, favoring a cyst. Visualized portions of the spleen, pancreas, stomach and bowel are otherwise grossly unremarkable. No upper abdominal adenopathy.  No worrisome lytic or sclerotic lesions. Degenerative changes are seen in the spine. Thoracotomy changes on the right.  IMPRESSION: 1. Postoperative changes of right lower lobectomy without evidence of recurrent or metastatic disease. 2. Left upper lobe ground-glass nodules are unchanged. Continued attention on followup exams is warranted. 3. Extensive 3 vessel coronary artery calcification.   Electronically Signed   By: Lorin Picket M.D.   On: 06/09/2014 12:27      Ct Chest Low Dose Pilot W/o Cm  04/29/2013   *RADIOLOGY REPORT*  Clinical Data: Follow-up lung cancer.  CT CHEST LOW DOSE PILOT WITHOUT CONTRAST  Technique: Multidetector CT imaging of the chest using the standard low-dose protocol without administration of intravenous contrast.  Comparison: Chest CT 04/02/2012  Findings: Low dose technique limits evaluation.  Visualized neck base is unremarkable.  No enlarged axillary, mediastinal or hilar lymphadenopathy.  Normal heart size.  Coronary artery calcifications.  No pericardial effusion.  Large hiatal hernia.  Central airways are patent.  Minimal ground-glass opacity within the dependent left lower lobe.  Unchanged 7 mm ground-glass opacity within the left upper lobe (image 34; series 3).  Postsurgical change compatible  with right lower lobe resection. Unchanged peripheral scarring / postsurgical change within the residual right lung.  No pleural effusion pneumothorax.  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  Incidental imaging of the upper abdomen demonstrates post cholecystectomy changes.  Normal bilateral adrenal glands.  No aggressive or acute appearing osseous lesions.  IMPRESSION: 1.  Postsurgical change related to prior right lower lobe resection.  No evidence for recurrent or metastatic disease. 2.  Unchanged small ground-glass nodule within the left upper lobe. Attention on follow-up. 3.  Interval development of patchy ground-glass opacities within the left lower lobe which likely represent atelectasis.  Aspiration or infection are considered less likely.   Original Report Authenticated By: Lovey Newcomer, M.D      Recent Lab Findings: Lab Results  Component Value Date   WBC 7.4 06/13/2009   HGB 14.6 06/13/2009   HCT 44.3 06/13/2009   PLT 163.0  06/13/2009   GLUCOSE 106* 06/14/2009   NA 142 06/13/2009   K 4.6 06/13/2009   CL 106 06/13/2009   CREATININE 1.5 06/13/2009   BUN 21 06/13/2009   CO2 29 06/13/2009   INR 1.1 ratio* 06/13/2009      Assessment / Plan:   Patient has been stable without evidence of recurrence of hisAnocarcinoma the lung T1aN0M0 (stage 1)resected 06/28/2009, now almost 5 years- other small nodules noted on CT scan however remained unchanged for several years Will follow up with low dose cy of chest one year He has known coronary artery disease, but currently denies any anginal symptoms. He has missed some follow-ups with Dr. Frances Nickels, we  will arrange for him to have cardiology follow-up.  Grace Isaac MD  Beeper [phone] Office [phone] 06/09/2014 1:53 PM

## 2014-07-06 ENCOUNTER — Ambulatory Visit: Payer: Commercial Managed Care - HMO | Admitting: Cardiovascular Disease

## 2015-05-25 ENCOUNTER — Other Ambulatory Visit: Payer: Self-pay | Admitting: Cardiothoracic Surgery

## 2015-05-25 DIAGNOSIS — Z85118 Personal history of other malignant neoplasm of bronchus and lung: Secondary | ICD-10-CM

## 2015-06-20 ENCOUNTER — Ambulatory Visit
Admission: RE | Admit: 2015-06-20 | Discharge: 2015-06-20 | Disposition: A | Discharge status: Home / Self Care | Payer: Commercial Managed Care - HMO | Source: Ambulatory Visit | Attending: Cardiothoracic Surgery | Admitting: Cardiothoracic Surgery

## 2015-06-20 ENCOUNTER — Encounter: Payer: Self-pay | Admitting: Cardiothoracic Surgery

## 2015-06-20 ENCOUNTER — Ambulatory Visit (INDEPENDENT_AMBULATORY_CARE_PROVIDER_SITE_OTHER): Payer: Medicare HMO | Admitting: Cardiothoracic Surgery

## 2015-06-20 VITALS — BP 143/79 | HR 94 | Resp 20 | Ht 73.0 in | Wt 288.0 lb

## 2015-06-20 DIAGNOSIS — Z85118 Personal history of other malignant neoplasm of bronchus and lung: Secondary | ICD-10-CM

## 2015-06-20 DIAGNOSIS — C3431 Malignant neoplasm of lower lobe, right bronchus or lung: Secondary | ICD-10-CM

## 2015-06-20 NOTE — Progress Notes (Signed)
[address]       [place] [address]             [phone]                    Brent Mcguire [place] Medical Record #[id number] Date of Birth: 1934/08/23  Referring: Brent Hector, MD Primary Care: Brent Rile, MD  Chief Complaint:   POST OP FOLLOW UP Lung Resection   History of Present Illness:     Brent Mcguire returns to the office today in followup after resection of a right lower lobe adenocarcinoma the lung T1aN0M0 (stage 1)resected 06/28/2009. Since his resection the patient's had no symptoms involving the lungs.    He does have known coronary occlusive disease treated medically. He notes he continues to exercise each mornin on stationary bike,. He denies any angina. His exertional shortness of breath is unchanged.     Past Medical History  Diagnosis Date  . Coronary artery disease   . Hypertension   . Thyroid disease   . UTI (lower urinary tract infection)   . Lung mass   . Foot pain   . Fatigue   . Kidney disease   . Cholelithiases   . Anemia   . Gastric ulcer   . Diabetes mellitus   . Hypercholesteremia   . Obesity   . GI bleed   . Gallstones   . Adenocarcinoma ([place]) 07/08/2011    Right Lower Lobe of Lung     History  Smoking status  . Former Smoker  . Types: Cigarettes  . Quit date: 07/29/1993  Smokeless tobacco  . Never Used    History  Alcohol Use No     No Known Allergies  Current Outpatient Prescriptions  Medication Sig Dispense Refill  . aspirin 81 MG tablet Take 81 mg by mouth daily.      . carvedilol (COREG) 6.25 MG tablet Take 1 tablet (6.25 mg total) by mouth 2 (two) times daily with a meal. 180 tablet 3  . clopidogrel (PLAVIX) 75 MG tablet Take 1 tablet (75 mg total) by mouth daily. 90 tablet 3  . furosemide (LASIX) 40 MG tablet Take 40 mg by mouth 2 (two) times daily.    Marland Kitchen levothyroxine (SYNTHROID, LEVOTHROID) 175 MCG tablet Take 175 mcg by mouth daily before breakfast.    . Multiple Vitamin  (MULTIVITAMIN) tablet Take 1 tablet by mouth daily.      . simvastatin (ZOCOR) 40 MG tablet Take 40 mg by mouth at bedtime.       No current facility-administered medications for this visit.       Physical Exam: BP 143/79 mmHg  Pulse 94  Resp 20  Ht '6\' 1"'$  (1.854 m)  Wt 288 lb (130.636 kg)  BMI 38.01 kg/m2  SpO2 95%  General appearance: alert, cooperative, appears stated age and no distress Neurologic: intact Heart: regular rate and rhythm, S1, S2 normal, no murmur, click, rub or gallop Lungs: clear to auscultation bilaterally Abdomen: soft, non-tender; bowel sounds normal; no masses,  no organomegaly Extremities: extremities normal, atraumatic, no cyanosis or edema, Homans sign is negative, no sign of DVT and no edema, redness or tenderness in the calves or thighs I do not appreciate any cervical or supra-clavicular or axillary adenopathy, no palpable abdominal aneurysm  The right chest incisions are well-healed   Diagnostic Studies & Laboratory data:     Recent Radiology Findings: Ct Chest Wo Contrast  06/20/2015  CLINICAL DATA:  History of right lower lobectomy for lung cancer. Ex-smoker. EXAM: CT CHEST WITHOUT CONTRAST TECHNIQUE: Multidetector CT imaging of the chest was performed following the standard protocol without IV contrast. COMPARISON:  06/09/2014 FINDINGS: Mediastinum/Nodes: No supraclavicular adenopathy. Aortic and branch vessel atherosclerosis. Mild cardiomegaly with lipomatous hypertrophy of the interatrial septum. Multivessel coronary artery atherosclerosis. No mediastinal or definite hilar adenopathy, given limitations of unenhanced CT. Lungs/Pleura: No pleural fluid. Left-sided anterior pleural lipoma again identified. Foci of right-sided pleural thickening again identified. Right lower lobectomy. Right upper lobe calcified granuloma. Volume loss in the right lung base is similar. Left apical ground-glass and soft tissue nodularity is again identified. Maximally 6  mm (image 9, series 4). Coronal image 70. Subsegmental atelectasis in the dependent left lower lobe. Upper abdomen: Cholecystectomy. Normal imaged portions of the liver, spleen, stomach, pancreas, adrenal glands, kidneys. Musculoskeletal: No acute osseous abnormality. Accentuation of expected thoracic kyphosis IMPRESSION: 1. Status post right lower lobectomy, without recurrent or metastatic disease. 2. Similar left apical ground-glass and soft tissue nodularity. This warrants followup attention but is most likely benign. 3.  Atherosclerosis, including within the coronary arteries. Electronically Signed   By: Abigail Miyamoto M.D.   On: 06/20/2015 14:34   Ct Chest Wo Contrast  06/09/2014   CLINICAL DATA:  Lung cancer.  EXAM: CT CHEST WITHOUT CONTRAST  TECHNIQUE: Multidetector CT imaging of the chest was performed following the standard protocol without IV contrast.  COMPARISON:  04/29/2013 and 04/02/2012.  FINDINGS: No pathologically enlarged mediastinal or axillary lymph nodes. Hilar regions are difficult to definitively evaluate without IV contrast. Atherosclerotic calcification of the arterial vasculature, including three-vessel involvement of the coronary arteries. Heart size normal. No pericardial effusion. Small hiatal hernia.  Postoperative changes of right lower lobectomy with associated scarring and volume loss in the right hemi thorax, stable. Sub cm ground-glass nodular densities in the apical left upper lobe measure up to 7 mm, stable. Extrapleural fat proliferation or lipoma along the anterolateral margin of the left hemi thorax. No pleural fluid. Airway is otherwise unremarkable.  Incidental imaging of the upper abdomen shows the visualized portions of the liver to be grossly unremarkable. Cholecystectomy. Visualized portions of the adrenal glands and right kidney are unremarkable. Low-attenuation lesion in the upper pole left kidney measures 2.2 cm, likely stable from 04/02/2012, favoring a cyst.  Visualized portions of the spleen, pancreas, stomach and bowel are otherwise grossly unremarkable. No upper abdominal adenopathy.  No worrisome lytic or sclerotic lesions. Degenerative changes are seen in the spine. Thoracotomy changes on the right.  IMPRESSION: 1. Postoperative changes of right lower lobectomy without evidence of recurrent or metastatic disease. 2. Left upper lobe ground-glass nodules are unchanged. Continued attention on followup exams is warranted. 3. Extensive 3 vessel coronary artery calcification.   Electronically Signed   By: Lorin Picket M.D.   On: 06/09/2014 12:27      Ct Chest Low Dose Pilot W/o Cm  04/29/2013   *RADIOLOGY REPORT*  Clinical Data: Follow-up lung cancer.  CT CHEST LOW DOSE PILOT WITHOUT CONTRAST  Technique: Multidetector CT imaging of the chest using the standard low-dose protocol without administration of intravenous contrast.  Comparison: Chest CT 04/02/2012  Findings: Low dose technique limits evaluation.  Visualized neck base is unremarkable.  No enlarged axillary, mediastinal or hilar lymphadenopathy.  Normal heart size.  Coronary artery calcifications.  No pericardial effusion.  Large hiatal hernia.  Central airways are patent.  Minimal ground-glass opacity within the dependent left lower  lobe.  Unchanged 7 mm ground-glass opacity within the left upper lobe (image 34; series 3).  Postsurgical change compatible with right lower lobe resection. Unchanged peripheral scarring / postsurgical change within the residual right lung.  No pleural effusion pneumothorax.  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  Incidental imaging of the upper abdomen demonstrates post cholecystectomy changes.  Normal bilateral adrenal glands.  No aggressive or acute appearing osseous lesions.  IMPRESSION: 1.  Postsurgical change related to prior right lower lobe resection.  No evidence for recurrent or metastatic disease. 2.  Unchanged small ground-glass nodule within the  left upper lobe. Attention on follow-up. 3.  Interval development of patchy ground-glass opacities within the left lower lobe which likely represent atelectasis.  Aspiration or infection are considered less likely.   Original Report Authenticated By: Lovey Newcomer, M.D      Recent Lab Findings: Lab Results  Component Value Date   WBC 7.4 06/13/2009   HGB 14.6 06/13/2009   HCT 44.3 06/13/2009   PLT 163.0 06/13/2009   GLUCOSE 106* 06/14/2009   NA 142 06/13/2009   K 4.6 06/13/2009   CL 106 06/13/2009   CREATININE 1.5 06/13/2009   BUN 21 06/13/2009   CO2 29 06/13/2009   INR 1.1 ratio* 06/13/2009      Assessment / Plan:   Patient has been stable without evidence of recurrence of his Anocarcinoma the lung T1aN0M0 (stage 1)resected 06/28/2009, now almost 6  years- other small nodules  Left upper lobe noted on CT scan however remained unchanged for several years Will follow up with low dose cy of chest one year. He has known coronary artery disease, but currently denies any anginal symptoms. He has missed some follow-ups with cardiology.  Grace Isaac MD  Beeper [phone] Office [phone] 06/20/2015 3:43 PM

## 2016-05-20 ENCOUNTER — Other Ambulatory Visit: Payer: Self-pay | Admitting: Cardiothoracic Surgery

## 2016-05-20 DIAGNOSIS — C3431 Malignant neoplasm of lower lobe, right bronchus or lung: Secondary | ICD-10-CM

## 2016-05-24 ENCOUNTER — Other Ambulatory Visit: Payer: Self-pay | Admitting: Cardiothoracic Surgery

## 2016-05-24 DIAGNOSIS — C3431 Malignant neoplasm of lower lobe, right bronchus or lung: Secondary | ICD-10-CM

## 2016-06-13 ENCOUNTER — Ambulatory Visit (INDEPENDENT_AMBULATORY_CARE_PROVIDER_SITE_OTHER): Payer: Medicare HMO | Admitting: Cardiothoracic Surgery

## 2016-06-13 ENCOUNTER — Encounter: Payer: Self-pay | Admitting: Cardiothoracic Surgery

## 2016-06-13 ENCOUNTER — Ambulatory Visit
Admission: RE | Admit: 2016-06-13 | Discharge: 2016-06-13 | Disposition: A | Discharge status: Home / Self Care | Payer: Medicare HMO | Source: Ambulatory Visit | Attending: Cardiothoracic Surgery | Admitting: Cardiothoracic Surgery

## 2016-06-13 VITALS — BP 114/69 | HR 74 | Resp 18 | Ht 71.0 in | Wt 264.0 lb

## 2016-06-13 DIAGNOSIS — C3431 Malignant neoplasm of lower lobe, right bronchus or lung: Secondary | ICD-10-CM

## 2016-06-13 DIAGNOSIS — Z09 Encounter for follow-up examination after completed treatment for conditions other than malignant neoplasm: Secondary | ICD-10-CM

## 2016-06-13 DIAGNOSIS — Z85118 Personal history of other malignant neoplasm of bronchus and lung: Secondary | ICD-10-CM | POA: Diagnosis not present

## 2016-06-13 NOTE — Progress Notes (Signed)
[address]       [place] [address]             [phone]                    Khalik F Sligar Letcher Medical Record #[id number] Date of Birth: 1935-07-03  Referring: Josue Hector, MD Primary Care: Gilford Rile, MD  Chief Complaint:   POST OP FOLLOW UP Lung Resection previous right lower lobectomy for adenocarcinoma 06/28/2009  History of Present Illness:     Mr. 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 Since his resection the patient's had no symptoms involving the lungs.    He does have known coronary occlusive disease treated medically. He denies any cardiac symptoms, and when specifically asked is no longer follow-up with his cardiologist.  He notes he continues to exercise each mornin on stationary bike,. He denies any angina. His exertional shortness of breath is unchanged.  He is not smoking   Past Medical History:  Diagnosis Date  . Adenocarcinoma ([place]) 07/08/2011   Right Lower Lobe of Lung  . Anemia   . Cholelithiases   . Coronary artery disease   . Diabetes mellitus   . Fatigue   . Foot pain   . Gallstones   . Gastric ulcer   . GI bleed   . Hypercholesteremia   . Hypertension   . Kidney disease   . Lung mass   . Obesity   . Thyroid disease   . UTI (lower urinary tract infection)      History  Smoking Status  . Former Smoker  . Types: Cigarettes  . Quit date: 07/29/1993  Smokeless Tobacco  . Never Used    History  Alcohol Use No     No Known Allergies  Current Outpatient Prescriptions  Medication Sig Dispense Refill  . aspirin 81 MG tablet Take 81 mg by mouth daily.      . carvedilol (COREG) 6.25 MG tablet Take 1 tablet (6.25 mg total) by mouth 2 (two) times daily with a meal. 180 tablet 3  . clopidogrel (PLAVIX) 75 MG tablet Take 1 tablet (75 mg total) by mouth daily. 90 tablet 3  . furosemide (LASIX) 40 MG  tablet Take 40 mg by mouth 2 (two) times daily.    Marland Kitchen levothyroxine (SYNTHROID, LEVOTHROID) 175 MCG tablet Take 175 mcg by mouth daily before breakfast.    . Multiple Vitamin (MULTIVITAMIN) tablet Take 1 tablet by mouth daily.      . simvastatin (ZOCOR) 40 MG tablet Take 40 mg by mouth at bedtime.       No current facility-administered medications for this visit.        Physical Exam: BP 114/69 (BP Location: Left Arm, Patient Position: Sitting, Cuff Size: Large)   Pulse 74   Resp 18   Ht '5\' 11"'$  (1.803 m)   Wt 264 lb (119.7 kg)   SpO2 96% Comment: ON RA  BMI 36.82 kg/m   General appearance: alert, cooperative, appears stated age and no distress Neurologic: intact Heart: regular rate and rhythm, S1, S2 normal, no murmur, click, rub or gallop Lungs: clear to auscultation bilaterally Abdomen: soft, non-tender; bowel sounds normal; no masses,  no organomegaly Extremities: extremities normal, atraumatic, no cyanosis or edema, Homans sign is negative, no sign of DVT and no edema, redness or tenderness in the  calves or thighs I do not appreciate any cervical or supra-clavicular or axillary adenopathy, no palpable abdominal aneurysm Patient has no cervical or supraclavicular adenopathy   Diagnostic Studies & Laboratory data:     Recent Radiology Findings: Ct Chest Nodule Follow Up Low Dose W/o  Result Date: 06/13/2016 CLINICAL DATA:  81 year old male with history of right lower lobe lung cancer status post lobectomy in 2010. Followup study. EXAM: CT CHEST WITHOUT CONTRAST TECHNIQUE: Multidetector CT imaging of the chest was performed following the standard protocol without IV contrast. COMPARISON:  Chest CT 06/20/2015. FINDINGS: Cardiovascular: Heart size is normal. There is no significant pericardial fluid, thickening or pericardial calcification. There is aortic atherosclerosis, as well as atherosclerosis of the great vessels of the mediastinum and the coronary arteries, including  calcified atherosclerotic plaque in the left main, left anterior descending, left circumflex and right coronary arteries. Mediastinum/Nodes: No pathologically enlarged mediastinal or hilar lymph nodes. Please note that accurate exclusion of hilar adenopathy is limited on noncontrast CT scans. Small hiatal hernia. No axillary lymphadenopathy. Lungs/Pleura: Status post right lower lobectomy with compensatory hyperexpansion of the right middle and upper lobes. Small left upper lobe nodule near the apex measuring 6 mm (image 21 of series 4) is unchanged. There is an adjacent 9 x 3 mm nodule (mean diameter of 6 mm) in the left upper lobe (image 24 of series 4) which appears slightly larger than the prior examination. No other suspicious appearing pulmonary nodules or masses are noted. Fatty attenuation lesion in the lower left hemithorax laterally is similar to prior studies, most compatible with a pleural lipoma. No acute consolidative airspace disease. No pleural effusions. Tiny calcified granuloma in the periphery of the right upper lobe incidentally noted. Upper Abdomen: Aortic atherosclerosis.  Status post cholecystectomy. Musculoskeletal: There are no aggressive appearing lytic or blastic lesions noted in the visualized portions of the skeleton. IMPRESSION: 1. Status post right lower lobectomy. No findings to suggest local recurrence of disease. 2. Two nonspecific pulmonary nodules near the apex of the left upper lobe, as above. One of these measuring 6 mm has been stable compared a prior examinations, while the other lesion which currently measures 9 x 3 mm (mean diameter of 6 mm) has grown slightly compared to prior studies. Continued attention on future follow-up imaging is recommended. 3. Aortic atherosclerosis, in addition to left main and 3 vessel coronary artery disease. Assessment for potential risk factor modification, dietary therapy or pharmacologic therapy may be warranted, if clinically indicated. 4.  Additional incidental findings, as above. Electronically Signed   By: Vinnie Langton M.D.   On: 06/13/2016 12:46   Ct Chest Wo Contrast  06/20/2015  CLINICAL DATA:  History of right lower lobectomy for lung cancer. Ex-smoker. EXAM: CT CHEST WITHOUT CONTRAST TECHNIQUE: Multidetector CT imaging of the chest was performed following the standard protocol without IV contrast. COMPARISON:  06/09/2014 FINDINGS: Mediastinum/Nodes: No supraclavicular adenopathy. Aortic and branch vessel atherosclerosis. Mild cardiomegaly with lipomatous hypertrophy of the interatrial septum. Multivessel coronary artery atherosclerosis. No mediastinal or definite hilar adenopathy, given limitations of unenhanced CT. Lungs/Pleura: No pleural fluid. Left-sided anterior pleural lipoma again identified. Foci of right-sided pleural thickening again identified. Right lower lobectomy. Right upper lobe calcified granuloma. Volume loss in the right lung base is similar. Left apical ground-glass and soft tissue nodularity is again identified. Maximally 6 mm (image 9, series 4). Coronal image 70. Subsegmental atelectasis in the dependent left lower lobe. Upper abdomen: Cholecystectomy. Normal imaged portions of the liver, spleen, stomach,  pancreas, adrenal glands, kidneys. Musculoskeletal: No acute osseous abnormality. Accentuation of expected thoracic kyphosis IMPRESSION: 1. Status post right lower lobectomy, without recurrent or metastatic disease. 2. Similar left apical ground-glass and soft tissue nodularity. This warrants followup attention but is most likely benign. 3.  Atherosclerosis, including within the coronary arteries. Electronically Signed   By: Abigail Miyamoto M.D.   On: 06/20/2015 14:34   Ct Chest Wo Contrast  06/09/2014   CLINICAL DATA:  Lung cancer.  EXAM: CT CHEST WITHOUT CONTRAST  TECHNIQUE: Multidetector CT imaging of the chest was performed following the standard protocol without IV contrast.  COMPARISON:  04/29/2013 and  04/02/2012.  FINDINGS: No pathologically enlarged mediastinal or axillary lymph nodes. Hilar regions are difficult to definitively evaluate without IV contrast. Atherosclerotic calcification of the arterial vasculature, including three-vessel involvement of the coronary arteries. Heart size normal. No pericardial effusion. Small hiatal hernia.  Postoperative changes of right lower lobectomy with associated scarring and volume loss in the right hemi thorax, stable. Sub cm ground-glass nodular densities in the apical left upper lobe measure up to 7 mm, stable. Extrapleural fat proliferation or lipoma along the anterolateral margin of the left hemi thorax. No pleural fluid. Airway is otherwise unremarkable.  Incidental imaging of the upper abdomen shows the visualized portions of the liver to be grossly unremarkable. Cholecystectomy. Visualized portions of the adrenal glands and right kidney are unremarkable. Low-attenuation lesion in the upper pole left kidney measures 2.2 cm, likely stable from 04/02/2012, favoring a cyst. Visualized portions of the spleen, pancreas, stomach and bowel are otherwise grossly unremarkable. No upper abdominal adenopathy.  No worrisome lytic or sclerotic lesions. Degenerative changes are seen in the spine. Thoracotomy changes on the right.  IMPRESSION: 1. Postoperative changes of right lower lobectomy without evidence of recurrent or metastatic disease. 2. Left upper lobe ground-glass nodules are unchanged. Continued attention on followup exams is warranted. 3. Extensive 3 vessel coronary artery calcification.   Electronically Signed   By: Lorin Picket M.D.   On: 06/09/2014 12:27      Ct Chest Low Dose Pilot W/o Cm  04/29/2013   *RADIOLOGY REPORT*  Clinical Data: Follow-up lung cancer.  CT CHEST LOW DOSE PILOT WITHOUT CONTRAST  Technique: Multidetector CT imaging of the chest using the standard low-dose protocol without administration of intravenous contrast.  Comparison: Chest CT  04/02/2012  Findings: Low dose technique limits evaluation.  Visualized neck base is unremarkable.  No enlarged axillary, mediastinal or hilar lymphadenopathy.  Normal heart size.  Coronary artery calcifications.  No pericardial effusion.  Large hiatal hernia.  Central airways are patent.  Minimal ground-glass opacity within the dependent left lower lobe.  Unchanged 7 mm ground-glass opacity within the left upper lobe (image 34; series 3).  Postsurgical change compatible with right lower lobe resection. Unchanged peripheral scarring / postsurgical change within the residual right lung.  No pleural effusion pneumothorax.  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  Incidental imaging of the upper abdomen demonstrates post cholecystectomy changes.  Normal bilateral adrenal glands.  No aggressive or acute appearing osseous lesions.  IMPRESSION: 1.  Postsurgical change related to prior right lower lobe resection.  No evidence for recurrent or metastatic disease. 2.  Unchanged small ground-glass nodule within the left upper lobe. Attention on follow-up. 3.  Interval development of patchy ground-glass opacities within the left lower lobe which likely represent atelectasis.  Aspiration or infection are considered less likely.   Original Report Authenticated By: Lovey Newcomer, M.D  Recent Lab Findings: Lab Results  Component Value Date   WBC 7.4 06/13/2009   HGB 14.6 06/13/2009   HCT 44.3 06/13/2009   PLT 163.0 06/13/2009   GLUCOSE 106 (H) 06/14/2009   NA 142 06/13/2009   K 4.6 06/13/2009   CL 106 06/13/2009   CREATININE 1.5 06/13/2009   BUN 21 06/13/2009   CO2 29 06/13/2009   INR 1.1 ratio (H) 06/13/2009      Assessment / Plan:   Patient has been stable without evidence of recurrence of his Anocarcinoma the lung T1aN0M0 (stage 1)resected 06/28/2009, now almost 7  Years Status post right lower lobectomy. No findings to suggest local recurrence of disease. Two nonspecific pulmonary  nodules near the apex of the left upper lobe however have remained Minimally changed for several years  Will follow up with low dose cy of chest one year. He has known coronary artery disease, but currently denies any anginal symptoms. He no longer sees a cardiologist, he notes he will know when he needs to.  Grace Isaac MD  Beeper [phone] Office [phone] 06/13/2016 2:11 PM

## 2017-05-22 ENCOUNTER — Other Ambulatory Visit: Payer: Self-pay | Admitting: Cardiothoracic Surgery

## 2017-05-22 DIAGNOSIS — R911 Solitary pulmonary nodule: Secondary | ICD-10-CM

## 2017-06-05 ENCOUNTER — Ambulatory Visit: Payer: Medicare HMO | Admitting: Cardiothoracic Surgery

## 2017-06-05 ENCOUNTER — Ambulatory Visit
Admission: RE | Admit: 2017-06-05 | Discharge: 2017-06-05 | Disposition: A | Discharge status: Home / Self Care | Payer: Medicare HMO | Source: Ambulatory Visit | Attending: Cardiothoracic Surgery | Admitting: Cardiothoracic Surgery

## 2017-06-05 ENCOUNTER — Other Ambulatory Visit: Payer: Self-pay | Admitting: *Deleted

## 2017-06-05 ENCOUNTER — Encounter: Payer: Self-pay | Admitting: Cardiothoracic Surgery

## 2017-06-05 ENCOUNTER — Other Ambulatory Visit: Payer: Self-pay

## 2017-06-05 VITALS — BP 106/63 | HR 91 | Ht 71.0 in

## 2017-06-05 DIAGNOSIS — Z09 Encounter for follow-up examination after completed treatment for conditions other than malignant neoplasm: Secondary | ICD-10-CM | POA: Diagnosis not present

## 2017-06-05 DIAGNOSIS — C3430 Malignant neoplasm of lower lobe, unspecified bronchus or lung: Secondary | ICD-10-CM

## 2017-06-05 DIAGNOSIS — C3431 Malignant neoplasm of lower lobe, right bronchus or lung: Secondary | ICD-10-CM | POA: Diagnosis not present

## 2017-06-05 DIAGNOSIS — R911 Solitary pulmonary nodule: Secondary | ICD-10-CM

## 2017-06-05 NOTE — Progress Notes (Signed)
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 He is not smoking. He has had no cardiac issues, continues to go to the gym and ride bicycle on a daily basis.  Comes in today for follow-up CT of the chest   Cancer Staging Lung cancer, right lower lobe Staging form: Lung, AJCC 7th Edition - Clinical stage from 07/12/2009: Stage IA (T1a, N0, M0) - Unsigned   Past Medical History:  Diagnosis Date  . Adenocarcinoma ([place]) 07/08/2011   Right Lower Lobe of Lung  . Anemia   . Cholelithiases   . Coronary artery disease   . Diabetes mellitus   . Fatigue   . Foot pain   . Gallstones   . Gastric ulcer   . GI bleed   . Hypercholesteremia   . Hypertension   . Kidney disease   . Lung mass   . Obesity   . Thyroid disease   . UTI (lower urinary tract infection)      Social History   Tobacco Use  Smoking Status Former Smoker  . Types: Cigarettes  . Last attempt to quit: 07/29/1993  . Years since quitting: 23.8  Smokeless Tobacco Never Used    Social History   Substance and Sexual Activity  Alcohol Use No     No Known Allergies  Current Outpatient Medications  Medication Sig Dispense Refill  . aspirin 81 MG tablet Take 81 mg by mouth daily.      . carvedilol (COREG) 6.25 MG tablet Take 1 tablet (6.25 mg total) by mouth 2 (two) times daily with a meal. 180 tablet 3  . clopidogrel (PLAVIX) 75 MG tablet Take 1 tablet (75 mg total) by mouth daily. 90 tablet 3  . furosemide  (LASIX) 40 MG tablet Take 40 mg by mouth 2 (two) times daily.    Marland Kitchen levothyroxine (SYNTHROID, LEVOTHROID) 175 MCG tablet Take 175 mcg by mouth daily before breakfast.    . Multiple Vitamin (MULTIVITAMIN) tablet Take 1 tablet by mouth daily.      . simvastatin (ZOCOR) 40 MG tablet Take 40 mg by mouth at bedtime.       No current facility-administered medications for this visit.        Physical Exam: BP 106/63   Pulse 91   Ht 5\' 11"  (1.803 m)   SpO2 94%   BMI 36.82 kg/m   General appearance: alert and cooperative Head: Normocephalic, without obvious abnormality, atraumatic Neck: no adenopathy, no carotid bruit, no JVD, supple, symmetrical, trachea midline and thyroid not enlarged, symmetric, no tenderness/mass/nodules Lymph nodes: Cervical, supraclavicular, and axillary nodes normal. Resp: clear to auscultation bilaterally Back: symmetric, no curvature. ROM normal. No CVA tenderness. Cardio: regular rate and rhythm, S1, S2 normal, no murmur, click, rub or gallop GI: soft, non-tender; bowel sounds normal; no masses,  no organomegaly Extremities:  extremities normal, atraumatic, no cyanosis or edema and Homans sign is negative, no sign of DVT Neurologic: Grossly normal Patient has no cervical or supraclavicular adenopathy noted  Diagnostic Studies & Laboratory data:     Recent Radiology Findings: Ct Chest Nodule Follow Up Low Dose W/o  Result Date: 06/05/2017 CLINICAL DATA:  Patient with pulmonary nodules. History of right lung cancer. Follow-up evaluation. EXAM: CT CHEST WITHOUT CONTRAST TECHNIQUE: Multidetector CT imaging of the chest was performed following the standard protocol without IV contrast. COMPARISON:  CT chest 06/13/2016 FINDINGS: Cardiovascular: Normal heart size. Coronary arterial vascular calcifications. No pericardial effusion. Thoracic aortic vascular calcifications. Mediastinum/Nodes: No enlarged axillary, mediastinal or hilar lymphadenopathy. Small hiatal hernia.  Otherwise esophagus is unremarkable. Lungs/Pleura: Central airways are patent. Stable postsurgical changes compatible with right lower lobectomy. Interval increase in size of subpleural left apical nodule which was previously described as 2 separate nodules and on current exam appears as 1 larger nodule measuring 17 x 16 mm (image 20; series 3). The solid component is approximately 8 mm. Unchanged calcified granuloma right upper lobe (image 32; series 3). Unchanged subpleural lipoma left upper hemithorax. No pleural effusion or pneumothorax. Upper Abdomen: No acute process. Musculoskeletal: Thoracic spine degenerative changes. No aggressive or acute appearing osseous lesions. IMPRESSION: 1. Stable postsurgical changes compatible with right lower lobectomy. No findings to suggest locally recurrent disease. 2. Interval increase in size of left apical nodule which appears to represent 1 conglomerate nodule on current exam measuring 17 x 16 mm with a solid component measuring approximately 8 mm. Findings are concerning for the possibility of adenocarcinoma. Given the size on current exam and the interval change when compared to remote priors, recommend further evaluation with PET-CT. 3. Aortic Atherosclerosis (ICD10-I70.0). Electronically Signed   By: Lovey Newcomer M.D.   On: 06/05/2017 14:13   I have independently reviewed the above radiology studies  and reviewed the findings with the patient.    Ct Chest Nodule Follow Up Low Dose W/o  Result Date: 06/13/2016 CLINICAL DATA:  82 year old male with history of right lower lobe lung cancer status post lobectomy in 2010. Followup study. EXAM: CT CHEST WITHOUT CONTRAST TECHNIQUE: Multidetector CT imaging of the chest was performed following the standard protocol without IV contrast. COMPARISON:  Chest CT 06/20/2015. FINDINGS: Cardiovascular: Heart size is normal. There is no significant pericardial fluid, thickening or pericardial calcification. There is aortic  atherosclerosis, as well as atherosclerosis of the great vessels of the mediastinum and the coronary arteries, including calcified atherosclerotic plaque in the left main, left anterior descending, left circumflex and right coronary arteries. Mediastinum/Nodes: No pathologically enlarged mediastinal or hilar lymph nodes. Please note that accurate exclusion of hilar adenopathy is limited on noncontrast CT scans. Small hiatal hernia. No axillary lymphadenopathy. Lungs/Pleura: Status post right lower lobectomy with compensatory hyperexpansion of the right middle and upper lobes. Small left upper lobe nodule near the apex measuring 6 mm (image 21 of series 4) is unchanged. There is an adjacent 9 x 3 mm nodule (mean diameter of 6 mm) in the left upper lobe (image 24 of series 4) which appears slightly larger than the prior examination. No other suspicious appearing pulmonary nodules or masses are noted. Fatty attenuation lesion in the lower left hemithorax laterally is similar to prior studies, most compatible with a pleural lipoma. No acute consolidative airspace disease. No pleural effusions. Tiny calcified granuloma in the periphery of the right upper lobe incidentally noted. Upper Abdomen: Aortic atherosclerosis.  Status post cholecystectomy. Musculoskeletal: There  are no aggressive appearing lytic or blastic lesions noted in the visualized portions of the skeleton. IMPRESSION: 1. Status post right lower lobectomy. No findings to suggest local recurrence of disease. 2. Two nonspecific pulmonary nodules near the apex of the left upper lobe, as above. One of these measuring 6 mm has been stable compared a prior examinations, while the other lesion which currently measures 9 x 3 mm (mean diameter of 6 mm) has grown slightly compared to prior studies. Continued attention on future follow-up imaging is recommended. 3. Aortic atherosclerosis, in addition to left main and 3 vessel coronary artery disease. Assessment for  potential risk factor modification, dietary therapy or pharmacologic therapy may be warranted, if clinically indicated. 4. Additional incidental findings, as above. Electronically Signed   By: Vinnie Langton M.D.   On: 06/13/2016 12:46   Ct Chest Wo Contrast  06/20/2015  CLINICAL DATA:  History of right lower lobectomy for lung cancer. Ex-smoker. EXAM: CT CHEST WITHOUT CONTRAST TECHNIQUE: Multidetector CT imaging of the chest was performed following the standard protocol without IV contrast. COMPARISON:  06/09/2014 FINDINGS: Mediastinum/Nodes: No supraclavicular adenopathy. Aortic and branch vessel atherosclerosis. Mild cardiomegaly with lipomatous hypertrophy of the interatrial septum. Multivessel coronary artery atherosclerosis. No mediastinal or definite hilar adenopathy, given limitations of unenhanced CT. Lungs/Pleura: No pleural fluid. Left-sided anterior pleural lipoma again identified. Foci of right-sided pleural thickening again identified. Right lower lobectomy. Right upper lobe calcified granuloma. Volume loss in the right lung base is similar. Left apical ground-glass and soft tissue nodularity is again identified. Maximally 6 mm (image 9, series 4). Coronal image 70. Subsegmental atelectasis in the dependent left lower lobe. Upper abdomen: Cholecystectomy. Normal imaged portions of the liver, spleen, stomach, pancreas, adrenal glands, kidneys. Musculoskeletal: No acute osseous abnormality. Accentuation of expected thoracic kyphosis IMPRESSION: 1. Status post right lower lobectomy, without recurrent or metastatic disease. 2. Similar left apical ground-glass and soft tissue nodularity. This warrants followup attention but is most likely benign. 3.  Atherosclerosis, including within the coronary arteries. Electronically Signed   By: Abigail Miyamoto M.D.   On: 06/20/2015 14:34   Ct Chest Wo Contrast  06/09/2014   CLINICAL DATA:  Lung cancer.  EXAM: CT CHEST WITHOUT CONTRAST  TECHNIQUE: Multidetector  CT imaging of the chest was performed following the standard protocol without IV contrast.  COMPARISON:  04/29/2013 and 04/02/2012.  FINDINGS: No pathologically enlarged mediastinal or axillary lymph nodes. Hilar regions are difficult to definitively evaluate without IV contrast. Atherosclerotic calcification of the arterial vasculature, including three-vessel involvement of the coronary arteries. Heart size normal. No pericardial effusion. Small hiatal hernia.  Postoperative changes of right lower lobectomy with associated scarring and volume loss in the right hemi thorax, stable. Sub cm ground-glass nodular densities in the apical left upper lobe measure up to 7 mm, stable. Extrapleural fat proliferation or lipoma along the anterolateral margin of the left hemi thorax. No pleural fluid. Airway is otherwise unremarkable.  Incidental imaging of the upper abdomen shows the visualized portions of the liver to be grossly unremarkable. Cholecystectomy. Visualized portions of the adrenal glands and right kidney are unremarkable. Low-attenuation lesion in the upper pole left kidney measures 2.2 cm, likely stable from 04/02/2012, favoring a cyst. Visualized portions of the spleen, pancreas, stomach and bowel are otherwise grossly unremarkable. No upper abdominal adenopathy.  No worrisome lytic or sclerotic lesions. Degenerative changes are seen in the spine. Thoracotomy changes on the right.  IMPRESSION: 1. Postoperative changes of right lower lobectomy without evidence of  recurrent or metastatic disease. 2. Left upper lobe ground-glass nodules are unchanged. Continued attention on followup exams is warranted. 3. Extensive 3 vessel coronary artery calcification.   Electronically Signed   By: Lorin Picket M.D.   On: 06/09/2014 12:27      Ct Chest Low Dose Pilot W/o Cm  04/29/2013   *RADIOLOGY REPORT*  Clinical Data: Follow-up lung cancer.  CT CHEST LOW DOSE PILOT WITHOUT CONTRAST  Technique: Multidetector CT imaging  of the chest using the standard low-dose protocol without administration of intravenous contrast.  Comparison: Chest CT 04/02/2012  Findings: Low dose technique limits evaluation.  Visualized neck base is unremarkable.  No enlarged axillary, mediastinal or hilar lymphadenopathy.  Normal heart size.  Coronary artery calcifications.  No pericardial effusion.  Large hiatal hernia.  Central airways are patent.  Minimal ground-glass opacity within the dependent left lower lobe.  Unchanged 7 mm ground-glass opacity within the left upper lobe (image 34; series 3).  Postsurgical change compatible with right lower lobe resection. Unchanged peripheral scarring / postsurgical change within the residual right lung.  No pleural effusion pneumothorax.  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  Incidental imaging of the upper abdomen demonstrates post cholecystectomy changes.  Normal bilateral adrenal glands.  No aggressive or acute appearing osseous lesions.  IMPRESSION: 1.  Postsurgical change related to prior right lower lobe resection.  No evidence for recurrent or metastatic disease. 2.  Unchanged small ground-glass nodule within the left upper lobe. Attention on follow-up. 3.  Interval development of patchy ground-glass opacities within the left lower lobe which likely represent atelectasis.  Aspiration or infection are considered less likely.   Original Report Authenticated By: Lovey Newcomer, M.D      Recent Lab Findings: Lab Results  Component Value Date   WBC 7.4 06/13/2009   HGB 14.6 06/13/2009   HCT 44.3 06/13/2009   PLT 163.0 06/13/2009   GLUCOSE 106 (H) 06/14/2009   NA 142 06/13/2009   K 4.6 06/13/2009   CL 106 06/13/2009   CREATININE 1.5 06/13/2009   BUN 21 06/13/2009   CO2 29 06/13/2009   INR 1.1 ratio (H) 06/13/2009      Assessment / Plan:   Patient has been stable without evidence of recurrence of his Anocarcinoma the lung T1aN0M0 (stage 1)resected 06/28/2009 from the right  lung, now almost 8  Years.  The patient is turning 82 years old next week Status post right lower lobectomy. No findings to suggest local recurrence of disease. Two nonspecific pulmonary nodules near the apex of the left upper lobe however have remained Minimally changed for several years, but on the scan now appear to have coalesced to some degree.-See radiology report We will obtain a PET scan, and have radiology save the current CT is a super D format He has known coronary artery disease, but currently denies any anginal symptoms. He no longer sees a cardiologist, he notes he will know when he needs to.  I reviewed the scans with the patient, although not obvious that this is a new lung cancer with his previous smoking history we will evaluate further.  Plan to see him back in the next 1-2 weeks after the PET scan is completed Grace Isaac MD  [facility] [phone] 06/05/2017 2:18 PM

## 2017-06-16 ENCOUNTER — Encounter (HOSPITAL_COMMUNITY): Payer: Medicare HMO

## 2017-07-01 ENCOUNTER — Encounter (HOSPITAL_COMMUNITY)
Admission: RE | Admit: 2017-07-01 | Discharge: 2017-07-01 | Disposition: A | Discharge status: Home / Self Care | Payer: Medicare HMO | Source: Ambulatory Visit | Attending: Cardiothoracic Surgery | Admitting: Cardiothoracic Surgery

## 2017-07-01 DIAGNOSIS — C3430 Malignant neoplasm of lower lobe, unspecified bronchus or lung: Secondary | ICD-10-CM | POA: Diagnosis present

## 2017-07-01 LAB — GLUCOSE, CAPILLARY: Glucose-Capillary: 88 mg/dL (ref 65–99)

## 2017-07-01 MED ORDER — FLUDEOXYGLUCOSE F - 18 (FDG) INJECTION
13.4300 | Freq: Once | INTRAVENOUS | Status: AC | PRN
  Administered 2017-07-01: 13.43 via INTRAVENOUS

## 2017-07-09 ENCOUNTER — Other Ambulatory Visit: Payer: Self-pay

## 2017-07-09 ENCOUNTER — Ambulatory Visit: Payer: Medicare HMO | Admitting: Cardiothoracic Surgery

## 2017-07-09 VITALS — BP 128/64 | HR 85 | Ht 71.0 in | Wt 260.0 lb

## 2017-07-09 DIAGNOSIS — Z85118 Personal history of other malignant neoplasm of bronchus and lung: Secondary | ICD-10-CM | POA: Diagnosis not present

## 2017-07-09 DIAGNOSIS — Z09 Encounter for follow-up examination after completed treatment for conditions other than malignant neoplasm: Secondary | ICD-10-CM | POA: Diagnosis not present

## 2017-07-09 DIAGNOSIS — C3431 Malignant neoplasm of lower lobe, right bronchus or lung: Secondary | ICD-10-CM | POA: Diagnosis not present

## 2017-07-09 NOTE — Progress Notes (Signed)
[name]       [place], [address]             [phone]                    Brent Mcguire [facility] Record #[id number] Date of Birth: 10-Jan-1935  Referring: Josue Hector, MD Primary Care: [name], MD  Chief Complaint:   POST OP FOLLOW UP Lung Resection previous right lower lobectomy for adenocarcinoma 06/28/2009  History of Present Illness:     Brent Mcguire returns to the office today in followup after resection of a right lower lobe adenocarcinoma the lung T1aN0M0 (stage 1)resected 06/28/2009. He is not smoking. He has had no cardiac issues, continues to go to the gym and ride bicycle on a daily basis.  He does note that he tires more quickly.  Patient comes in today to review the findings of the recent PET scan, and decide on further diagnostic and/or treatment of slowly enlarging left upper lobe nodule.   Cancer Staging Lung cancer, right lower lobe Staging form: Lung, AJCC 7th Edition - Clinical stage from 07/12/2009: Stage IA (T1a, N0, M0) - Unsigned   Past Medical History:  Diagnosis Date  . Adenocarcinoma ([place]) 07/08/2011   Right Lower Lobe of Lung  . Anemia   . Cholelithiases   . Coronary artery disease   . Diabetes mellitus   . Fatigue   . Foot pain   . Gallstones   . Gastric ulcer   . GI bleed   . Hypercholesteremia   . Hypertension   . Kidney disease   . Lung mass   . Obesity   . Thyroid disease   . UTI (lower urinary tract infection)      Social History   Tobacco Use  Smoking Status Former Smoker  . Types: Cigarettes  . Last attempt to quit: 07/29/1993  . Years since quitting: 23.9  Smokeless Tobacco Never Used    Social History   Substance and Sexual Activity  Alcohol Use No     No Known Allergies  Current Outpatient Medications  Medication Sig Dispense Refill  . aspirin 81 MG tablet Take 81 mg by mouth daily.      . carvedilol (COREG) 6.25 MG tablet Take 1 tablet (6.25 mg total) by  mouth 2 (two) times daily with a meal. 180 tablet 3  . clopidogrel (PLAVIX) 75 MG tablet Take 1 tablet (75 mg total) by mouth daily. 90 tablet 3  . furosemide (LASIX) 40 MG tablet Take 40 mg by mouth 2 (two) times daily.    Marland Kitchen levothyroxine (SYNTHROID, LEVOTHROID) 175 MCG tablet Take 175 mcg by mouth daily before breakfast.    . Multiple Vitamin (MULTIVITAMIN) tablet Take 1 tablet by mouth daily.      . simvastatin (ZOCOR) 40 MG tablet Take 40 mg by mouth at bedtime.       No current facility-administered medications for this visit.        Physical Exam: BP 128/64   Pulse 85   Ht 5\' 11"  (1.803 m)   Wt 260 lb (117.9 kg)   SpO2 97%   BMI 36.26 kg/m  General appearance: alert, cooperative and no distress Head: Normocephalic, without obvious abnormality, atraumatic Neck: no adenopathy, no carotid bruit, no JVD, supple, symmetrical, trachea midline and thyroid not enlarged, symmetric, no tenderness/mass/nodules Lymph nodes: Cervical, supraclavicular, and axillary nodes normal. Resp: clear to auscultation bilaterally  Back: symmetric, no curvature. ROM normal. No CVA tenderness. Cardio: regular rate and rhythm, S1, S2 normal, no murmur, click, rub or gallop GI: soft, non-tender; bowel sounds normal; no masses,  no organomegaly Extremities: extremities normal, atraumatic, no cyanosis or edema and Homans sign is negative, no sign of DVT Neurologic: Grossly normal    Diagnostic Studies & Laboratory data:     Recent Radiology Findings: Nm Pet Image Restag (ps) Skull Base To Thigh  Result Date: 07/01/2017 CLINICAL DATA:  Initial treatment strategy for left upper lobe nodule. Previous right lower lobectomy for lung carcinoma. EXAM: NUCLEAR MEDICINE PET SKULL BASE TO THIGH TECHNIQUE: 13.4 mCi F-18 FDG was injected intravenously. Full-ring PET imaging was performed from the skull base to thigh after the radiotracer. CT data was obtained and used for attenuation correction and anatomic  localization. FASTING BLOOD GLUCOSE:  Value: 88 mg/dl COMPARISON:  CT on 06/05/2017 and PET-CT on 12/30/2008 FINDINGS: NECK:  No hypermetabolic lymph nodes or masses. CHEST: No hypermetabolic lymphadenopathy. 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 Postsurgical changes from right lower lobectomy, with scarring showing mild FDG uptake along the right lateral chest wall. A 1.7 cm irregular pulmonary nodule is seen in the medial left lung apex which was not seen on 2010 exam, and shows mild FDG uptake with SUV max of 3.1. ABDOMEN/PELVIS: No abnormal hypermetabolic activity within the liver, pancreas, adrenal glands, or spleen. No hypermetabolic lymph nodes in the abdomen or pelvis. Small hiatal hernia is seen. Mild to moderate prostate enlargement. Aortic atherosclerosis. Left-sided colonic diverticulosis, without evidence of diverticulitis. SKELETON: No focal hypermetabolic bone lesions to suggest skeletal metastasis. IMPRESSION: 1.7 cm irregular pulmonary nodule in medial left lung apex is new since 2010, and shows low-grade metabolic activity with SUV max of 3.1. Differential diagnosis includes low-grade carcinoma and inflammatory/infectious etiologies. No evidence of thoracic nodal or distant metastatic disease. Electronically Signed   By: Earle Gell M.D.   On: 07/01/2017 17:03   I have independently reviewed the above radiology studies  and reviewed the findings with the patient.    Ct Chest Nodule Follow Up Low Dose W/o  Result Date: 06/13/2016 CLINICAL DATA:  82 year old male with history of right lower lobe lung cancer status post lobectomy in 2010. Followup study. EXAM: CT CHEST WITHOUT CONTRAST TECHNIQUE: Multidetector CT imaging of the chest was performed following the standard protocol without IV contrast. COMPARISON:  Chest CT 06/20/2015. FINDINGS: Cardiovascular: Heart size is normal. There is no significant pericardial fluid, thickening or pericardial calcification. There is  aortic atherosclerosis, as well as atherosclerosis of the great vessels of the mediastinum and the coronary arteries, including calcified atherosclerotic plaque in the left main, left anterior descending, left circumflex and right coronary arteries. Mediastinum/Nodes: No pathologically enlarged mediastinal or hilar lymph nodes. Please note that accurate exclusion of hilar adenopathy is limited on noncontrast CT scans. Small hiatal hernia. No axillary lymphadenopathy. Lungs/Pleura: Status post right lower lobectomy with compensatory hyperexpansion of the right middle and upper lobes. Small left upper lobe nodule near the apex measuring 6 mm (image 21 of series 4) is unchanged. There is an adjacent 9 x 3 mm nodule (mean diameter of 6 mm) in the left upper lobe (image 24 of series 4) which appears slightly larger than the prior examination. No other suspicious appearing pulmonary nodules or masses are noted. Fatty attenuation lesion in the lower left hemithorax laterally is similar to prior studies, most compatible with a pleural lipoma. No acute consolidative airspace disease. No  pleural effusions. Tiny calcified granuloma in the periphery of the right upper lobe incidentally noted. Upper Abdomen: Aortic atherosclerosis.  Status post cholecystectomy. Musculoskeletal: There are no aggressive appearing lytic or blastic lesions noted in the visualized portions of the skeleton. IMPRESSION: 1. Status post right lower lobectomy. No findings to suggest local recurrence of disease. 2. Two nonspecific pulmonary nodules near the apex of the left upper lobe, as above. One of these measuring 6 mm has been stable compared a prior examinations, while the other lesion which currently measures 9 x 3 mm (mean diameter of 6 mm) has grown slightly compared to prior studies. Continued attention on future follow-up imaging is recommended. 3. Aortic atherosclerosis, in addition to left main and 3 vessel coronary artery disease. Assessment  for potential risk factor modification, dietary therapy or pharmacologic therapy may be warranted, if clinically indicated. 4. Additional incidental findings, as above. Electronically Signed   By: Vinnie Langton M.D.   On: 06/13/2016 12:46   Ct Chest Wo Contrast  06/20/2015  CLINICAL DATA:  History of right lower lobectomy for lung cancer. Ex-smoker. EXAM: CT CHEST WITHOUT CONTRAST TECHNIQUE: Multidetector CT imaging of the chest was performed following the standard protocol without IV contrast. COMPARISON:  06/09/2014 FINDINGS: Mediastinum/Nodes: No supraclavicular adenopathy. Aortic and branch vessel atherosclerosis. Mild cardiomegaly with lipomatous hypertrophy of the interatrial septum. Multivessel coronary artery atherosclerosis. No mediastinal or definite hilar adenopathy, given limitations of unenhanced CT. Lungs/Pleura: No pleural fluid. Left-sided anterior pleural lipoma again identified. Foci of right-sided pleural thickening again identified. Right lower lobectomy. Right upper lobe calcified granuloma. Volume loss in the right lung base is similar. Left apical ground-glass and soft tissue nodularity is again identified. Maximally 6 mm (image 9, series 4). Coronal image 70. Subsegmental atelectasis in the dependent left lower lobe. Upper abdomen: Cholecystectomy. Normal imaged portions of the liver, spleen, stomach, pancreas, adrenal glands, kidneys. Musculoskeletal: No acute osseous abnormality. Accentuation of expected thoracic kyphosis IMPRESSION: 1. Status post right lower lobectomy, without recurrent or metastatic disease. 2. Similar left apical ground-glass and soft tissue nodularity. This warrants followup attention but is most likely benign. 3.  Atherosclerosis, including within the coronary arteries. Electronically Signed   By: Abigail Miyamoto M.D.   On: 06/20/2015 14:34   Ct Chest Wo Contrast  06/09/2014   CLINICAL DATA:  Lung cancer.  EXAM: CT CHEST WITHOUT CONTRAST  TECHNIQUE:  Multidetector CT imaging of the chest was performed following the standard protocol without IV contrast.  COMPARISON:  04/29/2013 and 04/02/2012.  FINDINGS: No pathologically enlarged mediastinal or axillary lymph nodes. Hilar regions are difficult to definitively evaluate without IV contrast. Atherosclerotic calcification of the arterial vasculature, including three-vessel involvement of the coronary arteries. Heart size normal. No pericardial effusion. Small hiatal hernia.  Postoperative changes of right lower lobectomy with associated scarring and volume loss in the right hemi thorax, stable. Sub cm ground-glass nodular densities in the apical left upper lobe measure up to 7 mm, stable. Extrapleural fat proliferation or lipoma along the anterolateral margin of the left hemi thorax. No pleural fluid. Airway is otherwise unremarkable.  Incidental imaging of the upper abdomen shows the visualized portions of the liver to be grossly unremarkable. Cholecystectomy. Visualized portions of the adrenal glands and right kidney are unremarkable. Low-attenuation lesion in the upper pole left kidney measures 2.2 cm, likely stable from 04/02/2012, favoring a cyst. Visualized portions of the spleen, pancreas, stomach and bowel are otherwise grossly unremarkable. No upper abdominal adenopathy.  No worrisome lytic or sclerotic  lesions. Degenerative changes are seen in the spine. Thoracotomy changes on the right.  IMPRESSION: 1. Postoperative changes of right lower lobectomy without evidence of recurrent or metastatic disease. 2. Left upper lobe ground-glass nodules are unchanged. Continued attention on followup exams is warranted. 3. Extensive 3 vessel coronary artery calcification.   Electronically Signed   By: Lorin Picket M.D.   On: 06/09/2014 12:27      Ct Chest Low Dose Pilot W/o Cm  04/29/2013   *RADIOLOGY REPORT*  Clinical Data: Follow-up lung cancer.  CT CHEST LOW DOSE PILOT WITHOUT CONTRAST  Technique:  Multidetector CT imaging of the chest using the standard low-dose protocol without administration of intravenous contrast.  Comparison: Chest CT 04/02/2012  Findings: Low dose technique limits evaluation.  Visualized neck base is unremarkable.  No enlarged axillary, mediastinal or hilar lymphadenopathy.  Normal heart size.  Coronary artery calcifications.  No pericardial effusion.  Large hiatal hernia.  Central airways are patent.  Minimal ground-glass opacity within the dependent left lower lobe.  Unchanged 7 mm ground-glass opacity within the left upper lobe (image 34; series 3).  Postsurgical change compatible with right lower lobe resection. Unchanged peripheral scarring / postsurgical change within the residual right lung.  No pleural effusion pneumothorax.  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  Incidental imaging of the upper abdomen demonstrates post cholecystectomy changes.  Normal bilateral adrenal glands.  No aggressive or acute appearing osseous lesions.  IMPRESSION: 1.  Postsurgical change related to prior right lower lobe resection.  No evidence for recurrent or metastatic disease. 2.  Unchanged small ground-glass nodule within the left upper lobe. Attention on follow-up. 3.  Interval development of patchy ground-glass opacities within the left lower lobe which likely represent atelectasis.  Aspiration or infection are considered less likely.   Original Report Authenticated By: Lovey Newcomer, M.D      Recent Lab Findings: Lab Results  Component Value Date   WBC 7.4 06/13/2009   HGB 14.6 06/13/2009   HCT 44.3 06/13/2009   PLT 163.0 06/13/2009   GLUCOSE 106 (H) 06/14/2009   NA 142 06/13/2009   K 4.6 06/13/2009   CL 106 06/13/2009   CREATININE 1.5 06/13/2009   BUN 21 06/13/2009   CO2 29 06/13/2009   INR 1.1 ratio (H) 06/13/2009      Assessment / Plan:   Patient has been stable without evidence of recurrence of his Anocarcinoma the lung T1aN0M0 (stage 1)resected  06/28/2009 from the right lung, now almost 8  Years.  Status post right lower lobectomy. No findings to suggest local recurrence of disease.  Two nonspecific pulmonary nodules near the apex of the left upper lobe however have remained Minimally changed for several years, but on the scan now appear to have coalesced to some degree.-See radiology report  PET scan supports the suspicious nature of the changes in the left upper lobe.  The patient's films and history was reviewed and the [facility] conference.  Options include follow-up CT scan in 4 months, versus discussion about empiric radiation therapy stereotactically to the left upper lobe.  I are reviewed the films and did not think that they could do a CT guided needle biopsy.  Because of the location navigation bronchoscopy would have a low yield.  With the patient now 82 years old he is less interested in any surgical resection.   I discussed with him seeing Dr. Lisbeth Renshaw to to evaluate the place for empiric stereotactic radiotherapy to the left upper lobe.  Patient  is agreeable with being seen by Dr. Lisbeth Renshaw.    He has known coronary artery disease, but currently denies any anginal symptoms. He no longer sees a cardiologist, he notes he will know when he needs to.  Patient will be scheduled for a follow-up CT scan in 4 months.     Grace Isaac MD  Beeper [phone] Office [phone] 07/09/2017 3:19 PM

## 2017-07-11 ENCOUNTER — Encounter: Payer: Self-pay | Admitting: Cardiothoracic Surgery

## 2017-07-12 NOTE — Progress Notes (Addendum)
Thoracic Location of Tumor / Histology:   Medial left lung  apex 1.7 cm from Pet scan 07/01/17     Patient presented  months ago with symptoms of: annual MD visit with Dr. Newell Coral, asymptomatic  Biopsies of  (if applicable) revealed: none  Tobacco/Marijuana/Snuff/ETOH use:  Cigarettes quit 07/29/1993.no smokeless tobacco,no alcohol or drug use Past/Anticipated interventions by cardiothoracic surgery, if any:  Past/Anticipated interventions by medical oncology, if any:   Signs/Symptoms  Weight changes, if any: No  Respiratory complaints, if any: NO  Hemoptysis, if any: No  Pain issues, if any:  NO  SAFETY ISSUES: NO  Prior radiation? No  Pacemaker/ICD? NO  Is the patient on methotrexate? No  Current Complaints / other details:  Hxr Right lower lung resection  Previous right lower lobectomy for adenocarcinoma 06/28/2009 , kidney disease,thyroid disease,HTN,Gastric ulcer,CAD DM BP 114/61   Pulse 80   Temp 98.2 F (36.8 C) (Oral)   Resp 20   Ht 5\' 11"  (1.803 m)   Wt 266 lb 3.2 oz (120.7 kg)   SpO2 97%   BMI 37.13 kg/m   Wt Readings from Last 3 Encounters:  07/16/17 266 lb 3.2 oz (120.7 kg)  07/09/17 260 lb (117.9 kg)  06/13/16 264 lb (119.7 kg)

## 2017-07-16 ENCOUNTER — Ambulatory Visit
Admission: RE | Admit: 2017-07-16 | Discharge: 2017-07-16 | Disposition: A | Discharge status: Home / Self Care | Payer: Medicare HMO | Source: Ambulatory Visit | Attending: Radiation Oncology | Admitting: Radiation Oncology

## 2017-07-16 ENCOUNTER — Encounter: Payer: Self-pay | Admitting: Radiation Oncology

## 2017-07-16 DIAGNOSIS — E7800 Pure hypercholesterolemia, unspecified: Secondary | ICD-10-CM | POA: Insufficient documentation

## 2017-07-16 DIAGNOSIS — N289 Disorder of kidney and ureter, unspecified: Secondary | ICD-10-CM | POA: Diagnosis not present

## 2017-07-16 DIAGNOSIS — Z85118 Personal history of other malignant neoplasm of bronchus and lung: Secondary | ICD-10-CM | POA: Insufficient documentation

## 2017-07-16 DIAGNOSIS — C3412 Malignant neoplasm of upper lobe, left bronchus or lung: Secondary | ICD-10-CM | POA: Insufficient documentation

## 2017-07-16 DIAGNOSIS — Z79899 Other long term (current) drug therapy: Secondary | ICD-10-CM | POA: Diagnosis not present

## 2017-07-16 DIAGNOSIS — Z86010 Personal history of colonic polyps: Secondary | ICD-10-CM | POA: Diagnosis not present

## 2017-07-16 DIAGNOSIS — Z79890 Hormone replacement therapy (postmenopausal): Secondary | ICD-10-CM | POA: Diagnosis not present

## 2017-07-16 DIAGNOSIS — I2510 Atherosclerotic heart disease of native coronary artery without angina pectoris: Secondary | ICD-10-CM | POA: Diagnosis not present

## 2017-07-16 DIAGNOSIS — Z87891 Personal history of nicotine dependence: Secondary | ICD-10-CM | POA: Diagnosis not present

## 2017-07-16 DIAGNOSIS — E669 Obesity, unspecified: Secondary | ICD-10-CM | POA: Insufficient documentation

## 2017-07-16 DIAGNOSIS — Z6837 Body mass index (BMI) 37.0-37.9, adult: Secondary | ICD-10-CM | POA: Insufficient documentation

## 2017-07-16 DIAGNOSIS — Z87440 Personal history of urinary (tract) infections: Secondary | ICD-10-CM | POA: Diagnosis not present

## 2017-07-16 DIAGNOSIS — E119 Type 2 diabetes mellitus without complications: Secondary | ICD-10-CM | POA: Insufficient documentation

## 2017-07-16 DIAGNOSIS — Z7982 Long term (current) use of aspirin: Secondary | ICD-10-CM | POA: Insufficient documentation

## 2017-07-16 DIAGNOSIS — I10 Essential (primary) hypertension: Secondary | ICD-10-CM | POA: Diagnosis not present

## 2017-07-16 DIAGNOSIS — C3432 Malignant neoplasm of lower lobe, left bronchus or lung: Secondary | ICD-10-CM

## 2017-07-16 DIAGNOSIS — E079 Disorder of thyroid, unspecified: Secondary | ICD-10-CM | POA: Insufficient documentation

## 2017-07-16 DIAGNOSIS — Z8711 Personal history of peptic ulcer disease: Secondary | ICD-10-CM | POA: Diagnosis not present

## 2017-07-16 DIAGNOSIS — Z9849 Cataract extraction status, unspecified eye: Secondary | ICD-10-CM | POA: Diagnosis not present

## 2017-07-16 DIAGNOSIS — Z510 Encounter for antineoplastic radiation therapy: Secondary | ICD-10-CM | POA: Insufficient documentation

## 2017-07-16 NOTE — Progress Notes (Signed)
Please see the Nurse Progress Note in the MD Initial Consult Encounter for this patient. 

## 2017-07-16 NOTE — Progress Notes (Signed)
Radiation Oncology         (336) [phone] ________________________________  Name: Brent Mcguire        MRN: [id number]  Date of Service: 07/16/2017 DOB: 09-Dec-1934  QV:ZDGLOV, Clarita Crane., MD  [place] Nakayama, *     REFERRING PHYSICIAN: [place] Nakayama, *   DIAGNOSIS: Diagnoses of Malignant neoplasm of lower lobe of left lung ([place]) and Malignant neoplasm of bronchus of left upper lobe [facility]) were pertinent to this visit.   HISTORY OF PRESENT ILLNESS: Brent Mcguire is a 82 y.o. male seen at the request of Dr. Servando Snare. The patient was diagnosed with adenocarcinoma of the right lung T1aN0M0 (stage 1) and resected on 06/28/09. He has been followed by Dr. Servando Snare and there has been no evidence of local recurrence of the disease. However, two nonspecific pulmonary nodules near the apex of the left upper lobe were noted in follow-up CT scan on 06/13/16. They have undergone interval increase in size and now appear as one larger nodule with the most recent CT scan on 06/05/17 measuring 17 x 16 mm nodule with a solid component measuring 8 mm. PET scan on 07/01/17 confirmed a 1.7 cm irregular pulmonary nodule in the medial left lung apex. The patient is not interested in surgical resection. The patient is without symptoms or complaints at this time. He presents today to discuss the role of stereotactic radiotherapy as part of his disease management.     PREVIOUS RADIATION THERAPY: No   PAST MEDICAL HISTORY:  Past Medical History:  Diagnosis Date  . Adenocarcinoma ([place]) 07/08/2011   Right Lower Lobe of Lung  . Anemia   . Cholelithiases   . Coronary artery disease   . Diabetes mellitus   . Fatigue   . Foot pain   . Gallstones   . Gastric ulcer   . GI bleed   . Hypercholesteremia   . Hypertension   . Kidney disease   . Lung mass   . Obesity   . Thyroid disease   . UTI (lower urinary tract infection)        PAST SURGICAL HISTORY: Past Surgical History:  Procedure  Laterality Date  . CATARACT EXTRACTION    . COLONOSCOPY W/ POLYPECTOMY  2007   3 polyps  . ELBOW SURGERY     pilonidal cyst  . ELECTROMAGNETIC NAVIGATION BROCHOSCOPY  06/28/2009   Dr. Servando Snare  . PILONIDAL CYST EXCISION    . video assisted thoracoscopy, mini thoracotomy,right lower lobectomy with lymph node dissection  06/28/2009   Dr. Servando Snare     FAMILY HISTORY: History reviewed. No pertinent family history.   SOCIAL HISTORY:  reports that he quit smoking about 23 years ago. His smoking use included cigarettes. he has never used smokeless tobacco. He reports that he does not drink alcohol or use drugs.   ALLERGIES: Patient has no known allergies.   MEDICATIONS:  Current Outpatient Medications  Medication Sig Dispense Refill  . aspirin 81 MG tablet Take 81 mg by mouth daily.      . carvedilol (COREG) 6.25 MG tablet Take 1 tablet (6.25 mg total) by mouth 2 (two) times daily with a meal. 180 tablet 3  . ciprofloxacin (CIPRO) 500 MG tablet Take 500 mg by mouth 2 (two) times daily.    . clopidogrel (PLAVIX) 75 MG tablet Take 1 tablet (75 mg total) by mouth daily. 90 tablet 3  . furosemide (LASIX) 40 MG tablet Take 40 mg by mouth 2 (two) times daily.    Marland Kitchen  levothyroxine (SYNTHROID, LEVOTHROID) 175 MCG tablet Take 175 mcg by mouth daily before breakfast.    . Multiple Vitamin (MULTIVITAMIN) tablet Take 1 tablet by mouth daily.      . simvastatin (ZOCOR) 40 MG tablet Take 40 mg by mouth at bedtime.       No current facility-administered medications for this encounter.      REVIEW OF SYSTEMS: On review of systems, the patient reports that he is doing well overall. He denies any chest pain, shortness of breath, cough, fevers, chills, night sweats, unintended weight changes. He denies any bowel or bladder disturbances, and denies abdominal pain, nausea or vomiting. He denies any new musculoskeletal or joint aches or pains. A complete review of systems is obtained and is otherwise  negative.     PHYSICAL EXAM:  Wt Readings from Last 3 Encounters:  07/16/17 266 lb 3.2 oz (120.7 kg)  07/09/17 260 lb (117.9 kg)  06/13/16 264 lb (119.7 kg)   Temp Readings from Last 3 Encounters:  07/16/17 98.2 F (36.8 C) (Oral)   BP Readings from Last 3 Encounters:  07/16/17 114/61  07/09/17 128/64  06/05/17 106/63   Pulse Readings from Last 3 Encounters:  07/16/17 80  07/09/17 85  06/05/17 91   Pain Assessment Pain Score: 0-No pain/10  In general this is a well appearing caucasian gentleman in no acute distress. He's alert and oriented x4 and appropriate throughout the examination. Cardiopulmonary assessment is negative for acute distress and he exhibits normal effort.     ECOG = 0  0 - Asymptomatic (Fully active, able to carry on all predisease activities without restriction)  1 - Symptomatic but completely ambulatory (Restricted in physically strenuous activity but ambulatory and able to carry out work of a light or sedentary nature. For example, light housework, office work)  2 - Symptomatic, <50% in bed during the day (Ambulatory and capable of all self care but unable to carry out any work activities. Up and about more than 50% of waking hours)  3 - Symptomatic, >50% in bed, but not bedbound (Capable of only limited self-care, confined to bed or chair 50% or more of waking hours)  4 - Bedbound (Completely disabled. Cannot carry on any self-care. Totally confined to bed or chair)  5 - Death   Eustace Pen MM, Creech RH, Tormey DC, et al. [phone]). "Toxicity and response criteria of the [facility]". Thor Oncol. 5 (6): 649-55    LABORATORY DATA:  Lab Results  Component Value Date   WBC 7.4 06/13/2009   HGB 14.6 06/13/2009   HCT 44.3 06/13/2009   MCV 87.8 06/13/2009   PLT 163.0 06/13/2009   Lab Results  Component Value Date   NA 142 06/13/2009   K 4.6 06/13/2009   CL 106 06/13/2009   CO2 29 06/13/2009   No results found for:  ALT, AST, GGT, ALKPHOS, BILITOT    RADIOGRAPHY: Nm Pet Image Restag (ps) Skull Base To Thigh  Result Date: 07/01/2017 CLINICAL DATA:  Initial treatment strategy for left upper lobe nodule. Previous right lower lobectomy for lung carcinoma. EXAM: NUCLEAR MEDICINE PET SKULL BASE TO THIGH TECHNIQUE: 13.4 mCi F-18 FDG was injected intravenously. Full-ring PET imaging was performed from the skull base to thigh after the radiotracer. CT data was obtained and used for attenuation correction and anatomic localization. FASTING BLOOD GLUCOSE:  Value: 88 mg/dl COMPARISON:  CT on 06/05/2017 and PET-CT on 12/30/2008 FINDINGS: NECK:  No hypermetabolic lymph nodes or masses.  CHEST: No hypermetabolic lymphadenopathy. 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 Postsurgical changes from right lower lobectomy, with scarring showing mild FDG uptake along the right lateral chest wall. A 1.7 cm irregular pulmonary nodule is seen in the medial left lung apex which was not seen on 2010 exam, and shows mild FDG uptake with SUV max of 3.1. ABDOMEN/PELVIS: No abnormal hypermetabolic activity within the liver, pancreas, adrenal glands, or spleen. No hypermetabolic lymph nodes in the abdomen or pelvis. Small hiatal hernia is seen. Mild to moderate prostate enlargement. Aortic atherosclerosis. Left-sided colonic diverticulosis, without evidence of diverticulitis. SKELETON: No focal hypermetabolic bone lesions to suggest skeletal metastasis. IMPRESSION: 1.7 cm irregular pulmonary nodule in medial left lung apex is new since 2010, and shows low-grade metabolic activity with SUV max of 3.1. Differential diagnosis includes low-grade carcinoma and inflammatory/infectious etiologies. No evidence of thoracic nodal or distant metastatic disease. Electronically Signed   By: Earle Gell M.D.   On: 07/01/2017 17:03       IMPRESSION/PLAN: 1. Left tupper lobe tumor.  The patient's case has been reviewed in multidisciplnary thora  conference. Clinically, the patient appears to have a low-grade adenocarcinoma. Dr. Lisbeth Renshaw discussed the pathology findings and nature of lung tumors. We discussed the risks, benefits, short, and long term effects of radiotherapy and the patient is interested in proceeding. Dr. Lisbeth Renshaw discussed the logistics of radiotherapy and anticipates 3-5 SBRT treatments.  after this discussion, the patient does wish to proceed with treatment at this time. Patient will be scheduled for CT simulation and treatment planning on 07/18/17 at 10 am. Treatment is scheduled to begin one week later. A consent form was signed and a copy was provided to the patient.   The patient was seen today for 40 minutes, with the majority of the time spent counseling the patient on his diagnosis of cancer and coordinating his care.   The above documentation reflects my direct findings during this shared patient visit. Please see the separate note by Dr. Lisbeth Renshaw on this date for the remainder of the patient's plan of care.    This document serves as a record of services personally performed by Kyung Rudd, MD and Freeman Caldron, PA-C. It was created on thier behalf by Bethann Humble, a trained medical scribe. The creation of this record is based on the scribe's personal observations and the provider's statements to them. This document has been checked and approved by the attending provider.

## 2017-07-18 ENCOUNTER — Ambulatory Visit
Admission: RE | Admit: 2017-07-18 | Discharge: 2017-07-18 | Disposition: A | Discharge status: Home / Self Care | Payer: Medicare HMO | Source: Ambulatory Visit | Attending: Radiation Oncology | Admitting: Radiation Oncology

## 2017-07-18 DIAGNOSIS — C3412 Malignant neoplasm of upper lobe, left bronchus or lung: Secondary | ICD-10-CM

## 2017-07-18 DIAGNOSIS — Z510 Encounter for antineoplastic radiation therapy: Secondary | ICD-10-CM | POA: Diagnosis not present

## 2017-07-20 NOTE — Progress Notes (Signed)
  Radiation Oncology         [phone]) [phone] ________________________________  Name: Brent Mcguire MRN: [id number]  Date: 07/18/2017  DOB: [birth year]  RESPIRATORY MOTION MANAGEMENT SIMULATION  NARRATIVE:  In order to account for effect of respiratory motion on target structures and other organs in the planning and delivery of radiotherapy, this patient underwent respiratory motion management simulation.  To accomplish this, when the patient was brought to the CT simulation planning suite, 4D respiratoy motion management CT images were obtained.  The CT images were loaded into the planning software.  Then, using a variety of tools including Cine, MIP, and standard views, the target volume and planning target volumes (PTV) were delineated.  Avoidance structures were contoured.  Treatment planning then occurred.  Dose volume histograms were generated and reviewed for each of the requested structure.  The resulting plan was carefully reviewed and approved today.   ------------------------------------------------  [name], MD, PhD

## 2017-07-20 NOTE — Progress Notes (Signed)
[facility] Simulation and Treatment Planning Note   Name:  BINGHAM MILLETTE MRN: [id number]   Date: 07/20/2017  DOB: [birth year]  Status:outpatient    DIAGNOSIS:    ICD-10-CM   1. Malignant neoplasm of bronchus of left upper lobe ([facility]) C34.12      CONSENT VERIFIED:yes   SET UP: Patient is setup supine   IMMOBILIZATION: The patient was immobilized using a Vac Loc bag and an accuform device   NARRATIVE:The patient was brought to the [facility].  Identity was confirmed.  All relevant records and images related to the planned course of therapy were reviewed.  Then, the patient was positioned in a stable reproducible clinical set-up for radiation therapy. Abdominal compression was applied through vacuum.  4D CT images were obtained and reproducible breathing pattern was confirmed. Free breathing CT images were obtained.  Skin markings were placed.  The CT images were loaded into the planning software where the target and avoidance structures were contoured.  The radiation prescription was entered and confirmed.    TREATMENT PLANNING NOTE:  Treatment planning then occurred. I have requested : MLC's, isodose plan, basic dose calculation.  3 dimensional simulation is performed and dose volume histogram of the gross tumor volume, planning tumor volume and criticial normal structures including the spinal cord and lungs were analyzed and requested.  Special treatment procedure was performed due to high dose per fraction.  The patient will be monitored for increased risk of toxicity.  Daily imaging using cone beam CT will be used for target localization.  I anticipate that the patient will receive 60 Gy in 5 fractions to target volume. Further adjustments will be made based on the planning process is necessary.  ------------------------------------------------  [name], MD, PhD

## 2017-07-24 DIAGNOSIS — Z510 Encounter for antineoplastic radiation therapy: Secondary | ICD-10-CM | POA: Diagnosis not present

## 2017-07-25 ENCOUNTER — Ambulatory Visit
Admission: RE | Admit: 2017-07-25 | Discharge: 2017-07-25 | Disposition: A | Discharge status: Home / Self Care | Payer: Medicare HMO | Source: Ambulatory Visit | Attending: Radiation Oncology | Admitting: Radiation Oncology

## 2017-07-25 DIAGNOSIS — Z510 Encounter for antineoplastic radiation therapy: Secondary | ICD-10-CM | POA: Diagnosis not present

## 2017-07-28 ENCOUNTER — Ambulatory Visit
Admission: RE | Admit: 2017-07-28 | Discharge: 2017-07-28 | Disposition: A | Discharge status: Home / Self Care | Payer: Medicare HMO | Source: Ambulatory Visit | Attending: Radiation Oncology | Admitting: Radiation Oncology

## 2017-07-28 DIAGNOSIS — Z510 Encounter for antineoplastic radiation therapy: Secondary | ICD-10-CM | POA: Diagnosis not present

## 2017-07-30 ENCOUNTER — Ambulatory Visit
Admission: RE | Admit: 2017-07-30 | Discharge: 2017-07-30 | Disposition: A | Discharge status: Home / Self Care | Payer: Medicare HMO | Source: Ambulatory Visit | Attending: Radiation Oncology | Admitting: Radiation Oncology

## 2017-07-30 DIAGNOSIS — Z510 Encounter for antineoplastic radiation therapy: Secondary | ICD-10-CM | POA: Diagnosis not present

## 2017-08-01 ENCOUNTER — Ambulatory Visit
Admission: RE | Admit: 2017-08-01 | Discharge: 2017-08-01 | Disposition: A | Discharge status: Home / Self Care | Payer: Medicare HMO | Source: Ambulatory Visit | Attending: Radiation Oncology | Admitting: Radiation Oncology

## 2017-08-01 DIAGNOSIS — Z510 Encounter for antineoplastic radiation therapy: Secondary | ICD-10-CM | POA: Diagnosis not present

## 2017-08-04 ENCOUNTER — Ambulatory Visit
Admission: RE | Admit: 2017-08-04 | Discharge: 2017-08-04 | Disposition: A | Discharge status: Home / Self Care | Payer: Medicare HMO | Source: Ambulatory Visit | Attending: Radiation Oncology | Admitting: Radiation Oncology

## 2017-08-04 DIAGNOSIS — Z510 Encounter for antineoplastic radiation therapy: Secondary | ICD-10-CM | POA: Diagnosis not present

## 2017-08-06 ENCOUNTER — Ambulatory Visit: Payer: Medicare HMO

## 2017-08-08 ENCOUNTER — Ambulatory Visit: Payer: Medicare HMO

## 2017-08-19 ENCOUNTER — Encounter: Payer: Self-pay | Admitting: Radiation Oncology

## 2017-08-19 NOTE — Progress Notes (Signed)
  Radiation Oncology         (336) [phone] ________________________________  Name: Brent Mcguire MRN: [id number]  Date: 08/19/2017  DOB: 1935/05/15  End of Treatment Note  Diagnosis:  Malignant neoplasm of upper lobe, left bronchus or lung    Indication for treatment: Curative  Radiation treatment dates:  07/24/2017, 07/28/2017, 07/30/2017, 08/01/2017, 08/04/2017   Site/dose:  Left lung/ 12 Gy x 45fx  Beams/energy:   Photon using SBRT/SRT-3D technique/ 6X-FFF  Narrative: The patient tolerated radiation treatment relatively well.    Plan: The patient has completed radiation treatment. The patient will return to radiation oncology clinic for routine followup in one month. I advised them to call or return sooner if they have any questions or concerns related to their recovery or treatment.  ------------------------------------------------  [name], MD, PhD  This document serves as a record of services personally performed by Kyung Rudd, MD. It was created on his behalf by Valeta Harms, a trained medical scribe. The creation of this record is based on the scribe's personal observations and the provider's statements to them. This document has been checked and approved by the attending provider.

## 2017-09-08 ENCOUNTER — Other Ambulatory Visit: Payer: Self-pay

## 2017-09-08 ENCOUNTER — Encounter: Payer: Self-pay | Admitting: Radiation Oncology

## 2017-09-08 ENCOUNTER — Ambulatory Visit
Admission: RE | Admit: 2017-09-08 | Discharge: 2017-09-08 | Disposition: A | Discharge status: Home / Self Care | Payer: Medicare HMO | Source: Ambulatory Visit | Attending: Radiation Oncology | Admitting: Radiation Oncology

## 2017-09-08 VITALS — BP 131/76 | HR 85 | Temp 97.9°F | Resp 20 | Ht 71.0 in | Wt 267.8 lb

## 2017-09-08 DIAGNOSIS — Z923 Personal history of irradiation: Secondary | ICD-10-CM | POA: Insufficient documentation

## 2017-09-08 DIAGNOSIS — Z79899 Other long term (current) drug therapy: Secondary | ICD-10-CM | POA: Insufficient documentation

## 2017-09-08 DIAGNOSIS — C3411 Malignant neoplasm of upper lobe, right bronchus or lung: Secondary | ICD-10-CM | POA: Diagnosis not present

## 2017-09-08 DIAGNOSIS — C3412 Malignant neoplasm of upper lobe, left bronchus or lung: Secondary | ICD-10-CM

## 2017-09-08 DIAGNOSIS — Z7982 Long term (current) use of aspirin: Secondary | ICD-10-CM | POA: Diagnosis not present

## 2017-09-08 DIAGNOSIS — C7802 Secondary malignant neoplasm of left lung: Secondary | ICD-10-CM | POA: Diagnosis not present

## 2017-09-08 NOTE — Progress Notes (Signed)
Radiation Oncology         (336) [phone] ________________________________  Name: Brent Mcguire MRN: [id number]  Date of Service: 09/08/2017 DOB: 01-10-1935  Post Treatment Note  CC: [name], MD  [name], MD  Diagnosis:   Putative stage I NSCLC.   Interval Since Last Radiation:  5 weeks   07/24/2017-08/04/2017 SBRT Treatment:  Left upper lobe lung/ 12 Gy x 18fx    Narrative:  The patient returns today for routine follow-up. Brent Mcguire has a history of Stage IA, T1aN0M0, NSCLC, adenocarcinoma of the right lung s/p resection in 2010 with Dr. Servando Snare. He's been followed closely in surveillance without recurrent disease. He was found to have two nonspecific pulmonary nodules near the apex of the left upper lobe were noted in follow-up CT scan on 06/13/16. There was an interval increase in size and most recent CT scan on 06/05/17 noted a 17 x 16 mm nodule with a solid component measuring 8 mm in the left upper lobe. PET scan on 07/01/17 confirmed a 1.7 cm irregular pulmonary nodule in the medial left lung apex. After considering surgical resection he opted for SBRT which he completed several weeks ago.                      On review of systems, the patient states he's doing great. He continues to exercise 5 days a week and denies any difficulty with shortness of breath or chest pain. He denies any concerns with fevers, chills, productive cough, or concerns regarding infection. No other complaints are noted.  ALLERGIES:  has No Known Allergies.  Meds: Current Outpatient Medications  Medication Sig Dispense Refill  . aspirin 81 MG tablet Take 81 mg by mouth daily.      . carvedilol (COREG) 6.25 MG tablet Take 1 tablet (6.25 mg total) by mouth 2 (two) times daily with a meal. 180 tablet 3  . clopidogrel (PLAVIX) 75 MG tablet Take 1 tablet (75 mg total) by mouth daily. 90 tablet 3  . furosemide (LASIX) 40 MG tablet Take 40 mg by mouth 2 (two) times daily.    Marland Kitchen levothyroxine  (SYNTHROID, LEVOTHROID) 175 MCG tablet Take 175 mcg by mouth daily before breakfast.    . Multiple Vitamin (MULTIVITAMIN) tablet Take 1 tablet by mouth daily.      . simvastatin (ZOCOR) 40 MG tablet Take 40 mg by mouth at bedtime.       No current facility-administered medications for this encounter.     Physical Findings:  height is 5\' 11"  (1.803 m) and weight is 267 lb 12.8 oz (121.5 kg). His oral temperature is 97.9 F (36.6 C). His blood pressure is 131/76 and his pulse is 85. His respiration is 20 and oxygen saturation is 96%.  Pain Assessment Pain Score: 0-No pain/10 In general this is a well appearing caucasian male in no acute distress. He's alert and oriented x4 and appropriate throughout the examination. Cardiopulmonary assessment is negative for acute distress and he exhibits normal effort. He has normal breath sounds in all fields without crackles, rales, or rhonchi.   Lab Findings: Lab Results  Component Value Date   WBC 7.4 06/13/2009   HGB 14.6 06/13/2009   HCT 44.3 06/13/2009   MCV 87.8 06/13/2009   PLT 163.0 06/13/2009     Radiographic Findings: No results found.  Impression/Plan: 1. History of Stage IA, T1a, N0M0 NSCLC, adenocarcinoma of the right lung, with putative Stage IA2 cT1bN0M0 NSCLC of  the left upper lobe. The patient tolerated radiotherapy well and would like to follow up with Dr. Servando Snare in surveillance. He will be due for his first post treatment CT scan and since Dr. Servando Snare wants to see him in April, we will coordinate this scan to be the last week of March and to see him the first week of April. We would be happy to see him moving forward as needed.     Carola Rhine, PAC

## 2017-10-07 ENCOUNTER — Ambulatory Visit (HOSPITAL_COMMUNITY)
Admission: RE | Admit: 2017-10-07 | Discharge: 2017-10-07 | Disposition: A | Discharge status: Home / Self Care | Payer: Medicare HMO | Source: Ambulatory Visit | Attending: Radiation Oncology | Admitting: Radiation Oncology

## 2017-10-07 ENCOUNTER — Ambulatory Visit
Admission: RE | Admit: 2017-10-07 | Discharge: 2017-10-07 | Disposition: A | Discharge status: Home / Self Care | Payer: Medicare HMO | Source: Ambulatory Visit | Attending: Radiation Oncology | Admitting: Radiation Oncology

## 2017-10-07 DIAGNOSIS — C3412 Malignant neoplasm of upper lobe, left bronchus or lung: Secondary | ICD-10-CM | POA: Insufficient documentation

## 2017-10-07 DIAGNOSIS — Z87891 Personal history of nicotine dependence: Secondary | ICD-10-CM | POA: Insufficient documentation

## 2017-10-07 DIAGNOSIS — I700 Atherosclerosis of aorta: Secondary | ICD-10-CM | POA: Diagnosis not present

## 2017-10-07 DIAGNOSIS — Z923 Personal history of irradiation: Secondary | ICD-10-CM | POA: Insufficient documentation

## 2017-10-07 DIAGNOSIS — Z902 Acquired absence of lung [part of]: Secondary | ICD-10-CM | POA: Insufficient documentation

## 2017-10-07 DIAGNOSIS — J439 Emphysema, unspecified: Secondary | ICD-10-CM | POA: Insufficient documentation

## 2017-10-07 LAB — BUN & CREATININE (CHCC)
BUN: 17 mg/dL (ref 7–26)
Creatinine: 1.94 mg/dL — ABNORMAL HIGH (ref 0.70–1.30)
GFR, Est AFR Am: 36 mL/min — ABNORMAL LOW (ref >60)
GFR, Estimated: 31 mL/min — ABNORMAL LOW (ref >60)

## 2017-10-07 MED ORDER — IOPAMIDOL (ISOVUE-300) INJECTION 61%
INTRAVENOUS | Status: AC
  Administered 2017-10-07: 75 mL
  Filled 2017-10-07: qty 75

## 2017-10-07 MED ORDER — SODIUM CHLORIDE 0.9 % IJ SOLN
INTRAMUSCULAR | Status: AC
  Filled 2017-10-07: qty 50

## 2017-10-08 ENCOUNTER — Telehealth: Payer: Self-pay | Admitting: Radiation Oncology

## 2017-10-08 DIAGNOSIS — C3432 Malignant neoplasm of lower lobe, left bronchus or lung: Secondary | ICD-10-CM

## 2017-10-08 NOTE — Telephone Encounter (Signed)
I called pt to review his post treatment CT scan, but had to leave a message to ask him to call back to review results which shows stability of the recently treated LUL lesion. He had wanted to follow up long term with Dr. Servando Snare, so I'll place CT orders and request 6 month appt with Dr. Servando Snare.

## 2017-10-28 ENCOUNTER — Ambulatory Visit: Payer: Medicare HMO | Admitting: Cardiothoracic Surgery

## 2018-04-21 ENCOUNTER — Other Ambulatory Visit: Payer: Self-pay | Admitting: *Deleted

## 2018-04-22 ENCOUNTER — Ambulatory Visit (HOSPITAL_COMMUNITY)
Admission: RE | Admit: 2018-04-22 | Discharge: 2018-04-22 | Disposition: A | Discharge status: Home / Self Care | Payer: Medicare HMO | Source: Ambulatory Visit | Attending: Radiation Oncology | Admitting: Radiation Oncology

## 2018-04-22 DIAGNOSIS — I2510 Atherosclerotic heart disease of native coronary artery without angina pectoris: Secondary | ICD-10-CM | POA: Insufficient documentation

## 2018-04-22 DIAGNOSIS — I700 Atherosclerosis of aorta: Secondary | ICD-10-CM | POA: Insufficient documentation

## 2018-04-22 DIAGNOSIS — K760 Fatty (change of) liver, not elsewhere classified: Secondary | ICD-10-CM | POA: Insufficient documentation

## 2018-04-22 DIAGNOSIS — C3432 Malignant neoplasm of lower lobe, left bronchus or lung: Secondary | ICD-10-CM | POA: Diagnosis not present

## 2018-04-22 LAB — POCT I-STAT CREATININE: Creatinine, Ser: 1.5 mg/dL — ABNORMAL HIGH (ref 0.61–1.24)

## 2018-04-22 MED ORDER — IOHEXOL 300 MG/ML  SOLN
75.0000 mL | Freq: Once | INTRAMUSCULAR | Status: AC | PRN
  Administered 2018-04-22: 75 mL via INTRAVENOUS

## 2018-04-23 ENCOUNTER — Other Ambulatory Visit: Payer: Self-pay

## 2018-04-23 ENCOUNTER — Encounter: Payer: Self-pay | Admitting: Cardiothoracic Surgery

## 2018-04-23 ENCOUNTER — Ambulatory Visit: Payer: Medicare HMO | Admitting: Cardiothoracic Surgery

## 2018-04-23 VITALS — BP 117/75 | HR 82 | Resp 16 | Ht 71.0 in | Wt 270.0 lb

## 2018-04-23 DIAGNOSIS — C3431 Malignant neoplasm of lower lobe, right bronchus or lung: Secondary | ICD-10-CM | POA: Diagnosis not present

## 2018-04-23 DIAGNOSIS — R911 Solitary pulmonary nodule: Secondary | ICD-10-CM | POA: Diagnosis not present

## 2018-04-23 NOTE — Progress Notes (Signed)
[address]       [place] [address]             [phone]                    Brent Mcguire [facility] Medical Record #[id number] Date of Birth: 04/12/1935  Referring: Kyung Rudd, MD Primary Care: [name], MD  Chief Complaint:   POST OP FOLLOW UP Lung Resection previous right lower lobectomy for adenocarcinoma 06/28/2009  History of Present Illness:     Mr. 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 He is not smoking.  Patient had stereotactic radiotherapy to the left upper lobe, a new highly suspicious lesion that was never biopsied.  Patient comes in today to review the findings of the recent CT scan, to follow-up on status post radiation treatment to the left upper lobe stereotactic.   Past Medical History:  Diagnosis Date  . Adenocarcinoma ([place]) 07/08/2011   Right Lower Lobe of Lung  . Anemia   . Cholelithiases   . Coronary artery disease   . Diabetes mellitus   . Fatigue   . Foot pain   . Gallstones   . Gastric ulcer   . GI bleed   . Hypercholesteremia   . Hypertension   . Kidney disease   . Lung mass   . Obesity   . Thyroid disease   . UTI (lower urinary tract infection)      Social History   Tobacco Use  Smoking Status Former Smoker  . Types: Cigarettes  . Last attempt to quit: 07/29/1993  . Years since quitting: 24.7  Smokeless Tobacco Never Used    Social History   Substance and Sexual Activity  Alcohol Use No     No Known Allergies  Current Outpatient Medications  Medication Sig Dispense Refill  . aspirin 81 MG tablet Take 81 mg by mouth daily.      . carvedilol (COREG) 6.25 MG tablet Take 1 tablet (6.25 mg total) by mouth 2 (two) times daily with a meal. 180 tablet 3  . clopidogrel (PLAVIX) 75 MG tablet Take 1 tablet (75 mg total) by mouth daily. 90 tablet 3  . furosemide (LASIX) 40 MG tablet Take  40 mg by mouth 2 (two) times daily.    Marland Kitchen levothyroxine (SYNTHROID, LEVOTHROID) 175 MCG tablet Take 175 mcg by mouth daily before breakfast.    . Multiple Vitamin (MULTIVITAMIN) tablet Take 1 tablet by mouth daily.      . simvastatin (ZOCOR) 40 MG tablet Take 40 mg by mouth at bedtime.       No current facility-administered medications for this visit.        Physical Exam: BP 117/75 (BP Location: Left Arm, Patient Position: Sitting, Cuff Size: Large)   Pulse 82   Resp 16   Ht 5\' 11"  (1.803 m)   Wt 270 lb (122.5 kg)   SpO2 96% Comment: RA  BMI 37.66 kg/m  General appearance: alert, cooperative and no distress Head: Normocephalic, without obvious abnormality, atraumatic Neck: no adenopathy, no carotid bruit, no JVD, supple, symmetrical, trachea midline and thyroid not enlarged, symmetric, no tenderness/mass/nodules Lymph nodes: Cervical, supraclavicular, and axillary nodes normal. Resp: clear to auscultation bilaterally Back: symmetric, no curvature. ROM normal. No CVA tenderness. Cardio: regular rate and rhythm, S1, S2 normal, no murmur, click, rub or gallop  GI: soft, non-tender; bowel sounds normal; no masses,  no organomegaly Extremities: extremities normal, atraumatic, no cyanosis or edema and Homans sign is negative, no sign of DVT Neurologic: Grossly normal   Diagnostic Studies & Laboratory data:     Recent Radiology Findings: Ct Chest W Contrast  Result Date: 04/23/2018 CLINICAL DATA:  83 year old male with history of adenocarcinoma of the right lung status post radiotherapy. EXAM: CT CHEST WITH CONTRAST TECHNIQUE: Multidetector CT imaging of the chest was performed during intravenous contrast administration. CONTRAST:  74mL OMNIPAQUE IOHEXOL 300 MG/ML  SOLN COMPARISON:  Chest CT 10/07/2017. FINDINGS: Cardiovascular: Heart size is normal. There is no significant pericardial fluid, thickening or pericardial calcification. There is aortic atherosclerosis, as well as  atherosclerosis of the great vessels of the mediastinum and the coronary arteries, including calcified atherosclerotic plaque in the left main, left anterior descending, left circumflex and right coronary arteries. Mediastinum/Nodes: Borderline enlarged superior mediastinal lymph node measuring 10 mm in short axis adjacent to the posterolateral aspect of the proximal trachea on the left side (axial image 15 of series 2), stable compared to prior examinations, nonspecific but presumably benign. No other pathologically enlarged mediastinal or hilar lymph nodes. Small hiatal hernia. No axillary lymphadenopathy. Lungs/Pleura: Status post right lower lobectomy, with compensatory hyperexpansion of the right upper and middle lobes. There is a mass-like area of architectural distortion in the left upper lobe which appears more confluent than the prior examination, most compatible with evolving postradiation mass-like fibrosis. No definite suspicious appearing pulmonary nodules or masses are noted on today's examination. Mild scarring in the periphery of the right lung. 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 No acute consolidative airspace disease. No pleural effusions. Upper Abdomen: Status post cholecystectomy. Aortic atherosclerosis. Low-attenuation lesions in the kidneys bilaterally, largest of which are compatible with simple cysts measuring up to 2.8 cm in the upper pole the left kidney. The other smaller low-attenuation lesions are too small to definitively characterize, but statistically likely to represent tiny cysts. Musculoskeletal: There are no aggressive appearing lytic or blastic lesions noted in the visualized portions of the skeleton. IMPRESSION: 1. Evolving postradiation changes near the apex of the left upper lobe,  most compatible with developing postradiation mass-like fibrosis. No suspicious findings to suggest metastatic disease on today's examination. 2. Aortic atherosclerosis, in addition to left main and 3 vessel coronary artery disease. 3. Hepatic steatosis. 4. Additional incidental findings, as above. Aortic Atherosclerosis (ICD10-I70.0). Electronically Signed   By: Vinnie Langton M.D.   On: 04/23/2018 09:45   I have independently reviewed the above radiology studies  and reviewed the findings with the patient.     Nm Pet Image Restag (ps) Skull Base To Thigh  Result Date: 07/01/2017 CLINICAL DATA:  Initial treatment strategy for left upper lobe nodule. Previous right lower lobectomy for lung carcinoma. EXAM: NUCLEAR MEDICINE PET SKULL BASE TO THIGH TECHNIQUE: 13.4 mCi F-18 FDG was injected intravenously. Full-ring PET imaging was performed from the skull base to thigh after the radiotracer. CT data was obtained and used for attenuation correction and anatomic localization. FASTING BLOOD GLUCOSE:  Value: 88 mg/dl COMPARISON:  CT on 06/05/2017 and PET-CT on 12/30/2008 FINDINGS: NECK:  No hypermetabolic lymph nodes or masses. CHEST: No hypermetabolic lymphadenopathy. 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 Postsurgical changes from right lower lobectomy,  with scarring showing mild FDG uptake along the right lateral chest wall. A 1.7 cm irregular pulmonary nodule is seen in the medial left lung apex which was not seen on 2010 exam, and shows mild FDG uptake with SUV max of 3.1. ABDOMEN/PELVIS: No abnormal hypermetabolic activity within the liver, pancreas, adrenal glands, or spleen. No hypermetabolic lymph nodes in the abdomen or pelvis. Small hiatal hernia is seen. Mild to moderate prostate enlargement. Aortic atherosclerosis. Left-sided colonic diverticulosis, without evidence of diverticulitis. SKELETON: No focal hypermetabolic bone lesions to suggest skeletal metastasis. IMPRESSION: 1.7 cm  irregular pulmonary nodule in medial left lung apex is new since 2010, and shows low-grade metabolic activity with SUV max of 3.1. Differential diagnosis includes low-grade carcinoma and inflammatory/infectious etiologies. No evidence of thoracic nodal or distant metastatic disease. Electronically Signed   By: Earle Gell M.D.   On: 07/01/2017 17:03       Ct Chest Nodule Follow Up Low Dose W/o  Result Date: 06/13/2016 CLINICAL DATA:  83 year old male with history of right lower lobe lung cancer status post lobectomy in 2010. Followup study. EXAM: CT CHEST WITHOUT CONTRAST TECHNIQUE: Multidetector CT imaging of the chest was performed following the standard protocol without IV contrast. COMPARISON:  Chest CT 06/20/2015. FINDINGS: Cardiovascular: Heart size is normal. There is no significant pericardial fluid, thickening or pericardial calcification. There is aortic atherosclerosis, as well as atherosclerosis of the great vessels of the mediastinum and the coronary arteries, including calcified atherosclerotic plaque in the left main, left anterior descending, left circumflex and right coronary arteries. Mediastinum/Nodes: No pathologically enlarged mediastinal or hilar lymph nodes. Please note that accurate exclusion of hilar adenopathy is limited on noncontrast CT scans. Small hiatal hernia. No axillary lymphadenopathy. Lungs/Pleura: Status post right lower lobectomy with compensatory hyperexpansion of the right middle and upper lobes. Small left upper lobe nodule near the apex measuring 6 mm (image 21 of series 4) is unchanged. There is an adjacent 9 x 3 mm nodule (mean diameter of 6 mm) in the left upper lobe (image 24 of series 4) which appears slightly larger than the prior examination. No other suspicious appearing pulmonary nodules or masses are noted. Fatty attenuation lesion in the lower left hemithorax laterally is similar to prior studies, most compatible with a pleural lipoma. No acute  consolidative airspace disease. No pleural effusions. Tiny calcified granuloma in the periphery of the right upper lobe incidentally noted. Upper Abdomen: Aortic atherosclerosis.  Status post cholecystectomy. Musculoskeletal: There are no aggressive appearing lytic or blastic lesions noted in the visualized portions of the skeleton. IMPRESSION: 1. Status post right lower lobectomy. No findings to suggest local recurrence of disease. 2. Two nonspecific pulmonary nodules near the apex of the left upper lobe, as above. One of these measuring 6 mm has been stable compared a prior examinations, while the other lesion which currently measures 9 x 3 mm (mean diameter of 6 mm) has grown slightly compared to prior studies. Continued attention on future follow-up imaging is recommended. 3. Aortic atherosclerosis, in addition to left main and 3 vessel coronary artery disease. Assessment for potential risk factor modification, dietary therapy or pharmacologic therapy may be warranted, if clinically indicated. 4. Additional incidental findings, as above. Electronically Signed   By: Vinnie Langton M.D.   On: 06/13/2016 12:46   Ct Chest Wo Contrast  06/20/2015  CLINICAL DATA:  History of right lower lobectomy for lung cancer. Ex-smoker. EXAM: CT CHEST WITHOUT CONTRAST TECHNIQUE: Multidetector CT imaging of the chest was performed  following the standard protocol without IV contrast. COMPARISON:  06/09/2014 FINDINGS: Mediastinum/Nodes: No supraclavicular adenopathy. Aortic and branch vessel atherosclerosis. Mild cardiomegaly with lipomatous hypertrophy of the interatrial septum. Multivessel coronary artery atherosclerosis. No mediastinal or definite hilar adenopathy, given limitations of unenhanced CT. Lungs/Pleura: No pleural fluid. Left-sided anterior pleural lipoma again identified. Foci of right-sided pleural thickening again identified. Right lower lobectomy. Right upper lobe calcified granuloma. Volume loss in the right  lung base is similar. Left apical ground-glass and soft tissue nodularity is again identified. Maximally 6 mm (image 9, series 4). Coronal image 70. Subsegmental atelectasis in the dependent left lower lobe. Upper abdomen: Cholecystectomy. Normal imaged portions of the liver, spleen, stomach, pancreas, adrenal glands, kidneys. Musculoskeletal: No acute osseous abnormality. Accentuation of expected thoracic kyphosis IMPRESSION: 1. Status post right lower lobectomy, without recurrent or metastatic disease. 2. Similar left apical ground-glass and soft tissue nodularity. This warrants followup attention but is most likely benign. 3.  Atherosclerosis, including within the coronary arteries. Electronically Signed   By: Abigail Miyamoto M.D.   On: 06/20/2015 14:34   Ct Chest Wo Contrast  06/09/2014   CLINICAL DATA:  Lung cancer.  EXAM: CT CHEST WITHOUT CONTRAST  TECHNIQUE: Multidetector CT imaging of the chest was performed following the standard protocol without IV contrast.  COMPARISON:  04/29/2013 and 04/02/2012.  FINDINGS: No pathologically enlarged mediastinal or axillary lymph nodes. Hilar regions are difficult to definitively evaluate without IV contrast. Atherosclerotic calcification of the arterial vasculature, including three-vessel involvement of the coronary arteries. Heart size normal. No pericardial effusion. Small hiatal hernia.  Postoperative changes of right lower lobectomy with associated scarring and volume loss in the right hemi thorax, stable. Sub cm ground-glass nodular densities in the apical left upper lobe measure up to 7 mm, stable. Extrapleural fat proliferation or lipoma along the anterolateral margin of the left hemi thorax. No pleural fluid. Airway is otherwise unremarkable.  Incidental imaging of the upper abdomen shows the visualized portions of the liver to be grossly unremarkable. Cholecystectomy. Visualized portions of the adrenal glands and right kidney are unremarkable. Low-attenuation  lesion in the upper pole left kidney measures 2.2 cm, likely stable from 04/02/2012, favoring a cyst. Visualized portions of the spleen, pancreas, stomach and bowel are otherwise grossly unremarkable. No upper abdominal adenopathy.  No worrisome lytic or sclerotic lesions. Degenerative changes are seen in the spine. Thoracotomy changes on the right.  IMPRESSION: 1. Postoperative changes of right lower lobectomy without evidence of recurrent or metastatic disease. 2. Left upper lobe ground-glass nodules are unchanged. Continued attention on followup exams is warranted. 3. Extensive 3 vessel coronary artery calcification.   Electronically Signed   By: Lorin Picket M.D.   On: 06/09/2014 12:27      Ct Chest Low Dose Pilot W/o Cm  04/29/2013   *RADIOLOGY REPORT*  Clinical Data: Follow-up lung cancer.  CT CHEST LOW DOSE PILOT WITHOUT CONTRAST  Technique: Multidetector CT imaging of the chest using the standard low-dose protocol without administration of intravenous contrast.  Comparison: Chest CT 04/02/2012  Findings: Low dose technique limits evaluation.  Visualized neck base is unremarkable.  No enlarged axillary, mediastinal or hilar lymphadenopathy.  Normal heart size.  Coronary artery calcifications.  No pericardial effusion.  Large hiatal hernia.  Central airways are patent.  Minimal ground-glass opacity within the dependent left lower lobe.  Unchanged 7 mm ground-glass opacity within the left upper lobe (image 34; series 3).  Postsurgical change compatible with right lower lobe resection. Unchanged peripheral scarring /  postsurgical change within the residual right lung.  No pleural effusion pneumothorax.  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  Incidental imaging of the upper abdomen demonstrates post cholecystectomy changes.  Normal bilateral adrenal glands.  No aggressive or acute appearing osseous lesions.  IMPRESSION: 1.  Postsurgical change related to prior right lower lobe resection.   No evidence for recurrent or metastatic disease. 2.  Unchanged small ground-glass nodule within the left upper lobe. Attention on follow-up. 3.  Interval development of patchy ground-glass opacities within the left lower lobe which likely represent atelectasis.  Aspiration or infection are considered less likely.   Original Report Authenticated By: Lovey Newcomer, M.D      Recent Lab Findings: Lab Results  Component Value Date   WBC 7.4 06/13/2009   HGB 14.6 06/13/2009   HCT 44.3 06/13/2009   PLT 163.0 06/13/2009   GLUCOSE 106 (H) 06/14/2009   NA 142 06/13/2009   K 4.6 06/13/2009   CL 106 06/13/2009   CREATININE 1.50 (H) 04/22/2018   BUN 17 10/07/2017   CO2 29 06/13/2009   INR 1.1 ratio (H) 06/13/2009      Assessment / Plan:   Patient has been stable without evidence of recurrence of his Anocarcinoma the lung T1aN0M0 (stage 1)resected 06/28/2009 from the right lung, now almost 8  Years.  Status post right lower lobectomy. No findings to suggest local recurrence of disease.  Evidence of radiation changes left upper lobe noted-we will follow-up with CT scan in 6 months   Grace Isaac MD  Beeper [phone] Office [phone] 04/23/2018 11:50 AM

## 2018-04-28 ENCOUNTER — Telehealth: Payer: Self-pay | Admitting: Radiation Oncology

## 2018-05-04 NOTE — Telephone Encounter (Signed)
Opened in error. Pt reviewed scan with Dr. Servando Snare.

## 2018-08-26 ENCOUNTER — Other Ambulatory Visit: Payer: Self-pay | Admitting: *Deleted

## 2018-08-26 DIAGNOSIS — C3431 Malignant neoplasm of lower lobe, right bronchus or lung: Secondary | ICD-10-CM

## 2018-08-28 ENCOUNTER — Other Ambulatory Visit: Payer: Medicare HMO

## 2018-10-14 ENCOUNTER — Other Ambulatory Visit: Payer: Self-pay

## 2018-10-15 ENCOUNTER — Other Ambulatory Visit: Payer: Medicare HMO

## 2018-10-15 ENCOUNTER — Ambulatory Visit: Payer: Self-pay | Admitting: Cardiothoracic Surgery

## 2018-11-11 ENCOUNTER — Encounter: Payer: Self-pay | Admitting: Cardiothoracic Surgery

## 2018-11-11 NOTE — Progress Notes (Signed)
[address]       [place], [address]             [phone]                    Brent Mcguire [place] Medical Record #[id number] Date of Birth: 06-21-1935  Referring: Kyung Rudd, MD Primary Care: [name], MD Telehealth Visit     Virtual Visit via Telephone Note   This visit type was conducted due to national recommendations for restrictions regarding the COVID-19 Pandemic (e.g. social distancing) in an effort to limit this patient's exposure and mitigate transmission in our community.  Due to his co-morbid illnesses, this patient is at least at moderate risk for complications without adequate follow up.  This format is felt to be most appropriate for this patient at this time.  The patient did not have access to video technology/had technical difficulties with video requiring transitioning to audio format only (telephone).  All issues noted in this document were discussed and addressed.  No physical exam could be performed with this format.    Chief Complaint:   POST OP FOLLOW UP Lung Resection previous right lower lobectomy for adenocarcinoma 06/28/2009  History of Present Illness:  I contacted Brent Mcguire remotely due to the limitations of the current COVID pandemic on 11/12/2018  at  10:50 AM  verifying that I was speaking to Brent Mcguire whose birthday is 05/08/1935.   I discussed limitations of the evaluation and management  of patients remotely without  the benefit of physical exam.  The patient was agreeable with proceeding with a remote/ not face to face visit.      Brent Mcguire went today today and had a CT scan done of the chest in follow-up on status post radiation treatment to the left upper lobe stereotactic radiotherapy .After reviewing the CT scan I had a follow-up  call to review the scan results and his overall status.  He had resection of a right lower lobe adenocarcinoma the lung T1aN0M0 (stage 1)resected 06/28/2009.  He is not  smoking.  He noted that before the local gym closed he had been going and exercising on a regular basis.  He has known coronary occlusive disease followed by Dr. Frances Nickels but has had no anginal symptoms.   Patient had stereotactic radiotherapy to the left upper lobe, a new highly suspicious lesion that was never biopsied.- radiation completed 08/04/2017  Patient comes in today to review the findings of the recent CT scan, to follow-up on status post radiation treatment to the left upper lobe stereotactic.   Past Medical History:  Diagnosis Date   Adenocarcinoma ([place]) 07/08/2011   Right Lower Lobe of Lung   Anemia    Cholelithiases    Coronary artery disease    Diabetes mellitus    Fatigue    Foot pain    Gallstones    Gastric ulcer    GI bleed    Hypercholesteremia    Hypertension    Kidney disease    Lung mass    Obesity    Thyroid disease    UTI (lower urinary tract infection)    Past Surgical History:  Procedure Laterality Date   CATARACT EXTRACTION     COLONOSCOPY W/ POLYPECTOMY  2007   3 polyps   ELBOW SURGERY     pilonidal cyst   ELECTROMAGNETIC NAVIGATION BROCHOSCOPY  06/28/2009   Dr. Servando Snare  PILONIDAL CYST EXCISION     video assisted thoracoscopy, mini thoracotomy,right lower lobectomy with lymph node dissection  06/28/2009   Dr. Servando Snare    Social History   Tobacco Use  Smoking Status Former Smoker   Types: Cigarettes   Last attempt to quit: 07/29/1993   Years since quitting: 25.3  Smokeless Tobacco Never Used    Social History   Substance and Sexual Activity  Alcohol Use No     No Known Allergies  Current Outpatient Medications  Medication Sig Dispense Refill   aspirin 81 MG tablet Take 81 mg by mouth daily.       carvedilol (COREG) 6.25 MG tablet Take 1 tablet (6.25 mg total) by mouth 2 (two) times daily with a meal. 180 tablet 3   clopidogrel (PLAVIX) 75 MG tablet Take 1 tablet (75 mg total) by mouth daily. 90 tablet  3   furosemide (LASIX) 40 MG tablet Take 40 mg by mouth 2 (two) times daily.     levothyroxine (SYNTHROID, LEVOTHROID) 175 MCG tablet Take 175 mcg by mouth daily before breakfast.     Multiple Vitamin (MULTIVITAMIN) tablet Take 1 tablet by mouth daily.       simvastatin (ZOCOR) 40 MG tablet Take 40 mg by mouth at bedtime.       No current facility-administered medications for this visit.        Physical Exam: There were no vitals taken for this visit. No exam   Diagnostic Studies & Laboratory data:     Recent Radiology Findings  Ct Chest Wo Contrast  Result Date: 11/12/2018 CLINICAL DATA:  84 year old male with history of non-small cell lung cancer. Follow-up study. EXAM: CT CHEST WITHOUT CONTRAST TECHNIQUE: Multidetector CT imaging of the chest was performed following the standard protocol without IV contrast. COMPARISON:  Chest CT 04/22/2018. FINDINGS: Cardiovascular: Heart size is normal. There is no significant pericardial fluid, thickening or pericardial calcification. There is aortic atherosclerosis, as well as atherosclerosis of the great vessels of the mediastinum and the coronary arteries, including calcified atherosclerotic plaque in the left main, left anterior descending, left circumflex and right coronary arteries. Mediastinum/Nodes: No pathologically enlarged mediastinal or hilar lymph nodes. Please note that accurate exclusion of hilar adenopathy is limited on noncontrast CT scans. Small hiatal hernia. No axillary lymphadenopathy. Lungs/Pleura: Again noted is an area of mass-like architectural distortion in the apex of the left upper lobe which is very similar to the most recent prior study from 04/22/2018, most compatible with an area of postradiation mass-like fibrosis. More subtle peripheral pleuroparenchymal thickening and architectural distortion in the right lung involving portions of both the right middle and lower lobes laterally, similar to prior examinations, most  compatible with chronic scarring. A few patchy areas of subtle ground-glass attenuation are noted throughout the lungs, most evident in the left lower lobe, minimally increased compared to the prior examination, but nonspecific. No definite suspicious appearing pulmonary nodules or masses are noted. No acute consolidative airspace disease. No pleural effusions. Smoothly marginated 1.9 x 4.5 cm low-attenuation (-111 HU) lesion in the periphery of the left hemithorax, similar to the prior examination, most compatible with a pleural lipoma. Upper Abdomen: 9 mm high attenuation lesion in the upper pole of the right kidney, incompletely characterized, but favored to represent a proteinaceous/hemorrhagic cyst. 3.0 cm low-attenuation lesion in the upper pole of the left kidney, incompletely characterized on today's noncontrast CT examination, but statistically likely to represent a cyst. Status post cholecystectomy. Aortic atherosclerosis. Musculoskeletal: There are no  aggressive appearing lytic or blastic lesions noted in the visualized portions of the skeleton. IMPRESSION: 1. Essentially stable appearance of the thorax on today's examination with chronic postradiation mass-like fibrosis in the apex of the left upper lobe. 2. The exception on today's examination is a small amount of increasing ground-glass attenuation in the base of the left lower lobe which is nonspecific, but attention on follow-up studies is recommended. 3. Aortic atherosclerosis, in addition to left main and 3 vessel coronary artery disease. 4. Small hiatal hernia. 5. Additional incidental findings, as above. Aortic Atherosclerosis (ICD10-I70.0). Electronically Signed   By: Vinnie Langton M.D.   On: 11/12/2018 09:25   I have independently reviewed the above radiology studies  and reviewed the findings with the patient.  Ct Chest W Contrast  Result Date: 04/23/2018 CLINICAL DATA:  84 year old male with history of adenocarcinoma of the right  lung status post radiotherapy. EXAM: CT CHEST WITH CONTRAST TECHNIQUE: Multidetector CT imaging of the chest was performed during intravenous contrast administration. CONTRAST:  33mL OMNIPAQUE IOHEXOL 300 MG/ML  SOLN COMPARISON:  Chest CT 10/07/2017. FINDINGS: Cardiovascular: Heart size is normal. There is no significant pericardial fluid, thickening or pericardial calcification. There is aortic atherosclerosis, as well as atherosclerosis of the great vessels of the mediastinum and the coronary arteries, including calcified atherosclerotic plaque in the left main, left anterior descending, left circumflex and right coronary arteries. Mediastinum/Nodes: Borderline enlarged superior mediastinal lymph node measuring 10 mm in short axis adjacent to the posterolateral aspect of the proximal trachea on the left side (axial image 15 of series 2), stable compared to prior examinations, nonspecific but presumably benign. No other pathologically enlarged mediastinal or hilar lymph nodes. Small hiatal hernia. No axillary lymphadenopathy. Lungs/Pleura: Status post right lower lobectomy, with compensatory hyperexpansion of the right upper and middle lobes. There is a mass-like area of architectural distortion in the left upper lobe which appears more confluent than the prior examination, most compatible with evolving postradiation mass-like fibrosis. No definite suspicious appearing pulmonary nodules or masses are noted on today's examination. Mild scarring in the periphery of the right lung. 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 No acute consolidative airspace disease. No pleural effusions. Upper Abdomen: Status post cholecystectomy. Aortic atherosclerosis. Low-attenuation lesions in the kidneys bilaterally, largest of which are  compatible with simple cysts measuring up to 2.8 cm in the upper pole the left kidney. The other smaller low-attenuation lesions are too small to definitively characterize, but statistically likely to represent tiny cysts. Musculoskeletal: There are no aggressive appearing lytic or blastic lesions noted in the visualized portions of the skeleton. IMPRESSION: 1. Evolving postradiation changes near the apex of the left upper lobe, most compatible with developing postradiation mass-like fibrosis. No suspicious findings to suggest metastatic disease on today's examination. 2. Aortic atherosclerosis, in addition to left main and 3 vessel coronary artery disease. 3. Hepatic steatosis. 4. Additional incidental findings, as above. Aortic Atherosclerosis (ICD10-I70.0). Electronically Signed   By: Vinnie Langton M.D.   On: 04/23/2018 09:45    Nm Pet Image Restag (ps) Skull Base To Thigh  Result Date: 07/01/2017 CLINICAL DATA:  Initial treatment strategy for left upper lobe nodule. Previous right lower lobectomy for lung carcinoma. EXAM: NUCLEAR MEDICINE PET SKULL BASE TO THIGH TECHNIQUE: 13.4 mCi F-18 FDG was injected intravenously. Full-ring PET imaging was  performed from the skull base to thigh after the radiotracer. CT data was obtained and used for attenuation correction and anatomic localization. FASTING BLOOD GLUCOSE:  Value: 88 mg/dl COMPARISON:  CT on 06/05/2017 and PET-CT on 12/30/2008 FINDINGS: NECK:  No hypermetabolic lymph nodes or masses. CHEST: No hypermetabolic lymphadenopathy. 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 Postsurgical changes from right lower lobectomy, with scarring showing mild FDG uptake along the right lateral chest wall. A 1.7 cm irregular pulmonary nodule is seen in the medial left lung apex which was not seen on 2010 exam, and shows mild FDG uptake with SUV max of 3.1. ABDOMEN/PELVIS: No abnormal hypermetabolic activity within the liver, pancreas, adrenal glands, or  spleen. No hypermetabolic lymph nodes in the abdomen or pelvis. Small hiatal hernia is seen. Mild to moderate prostate enlargement. Aortic atherosclerosis. Left-sided colonic diverticulosis, without evidence of diverticulitis. SKELETON: No focal hypermetabolic bone lesions to suggest skeletal metastasis. IMPRESSION: 1.7 cm irregular pulmonary nodule in medial left lung apex is new since 2010, and shows low-grade metabolic activity with SUV max of 3.1. Differential diagnosis includes low-grade carcinoma and inflammatory/infectious etiologies. No evidence of thoracic nodal or distant metastatic disease. Electronically Signed   By: Earle Gell M.D.   On: 07/01/2017 17:03    Ct Chest Nodule Follow Up Low Dose W/o  Result Date: 06/13/2016 CLINICAL DATA:  84 year old male with history of right lower lobe lung cancer status post lobectomy in 2010. Followup study. EXAM: CT CHEST WITHOUT CONTRAST TECHNIQUE: Multidetector CT imaging of the chest was performed following the standard protocol without IV contrast. COMPARISON:  Chest CT 06/20/2015. FINDINGS: Cardiovascular: Heart size is normal. There is no significant pericardial fluid, thickening or pericardial calcification. There is aortic atherosclerosis, as well as atherosclerosis of the great vessels of the mediastinum and the coronary arteries, including calcified atherosclerotic plaque in the left main, left anterior descending, left circumflex and right coronary arteries. Mediastinum/Nodes: No pathologically enlarged mediastinal or hilar lymph nodes. Please note that accurate exclusion of hilar adenopathy is limited on noncontrast CT scans. Small hiatal hernia. No axillary lymphadenopathy. Lungs/Pleura: Status post right lower lobectomy with compensatory hyperexpansion of the right middle and upper lobes. Small left upper lobe nodule near the apex measuring 6 mm (image 21 of series 4) is unchanged. There is an adjacent 9 x 3 mm nodule (mean diameter of 6 mm) in  the left upper lobe (image 24 of series 4) which appears slightly larger than the prior examination. No other suspicious appearing pulmonary nodules or masses are noted. Fatty attenuation lesion in the lower left hemithorax laterally is similar to prior studies, most compatible with a pleural lipoma. No acute consolidative airspace disease. No pleural effusions. Tiny calcified granuloma in the periphery of the right upper lobe incidentally noted. Upper Abdomen: Aortic atherosclerosis.  Status post cholecystectomy. Musculoskeletal: There are no aggressive appearing lytic or blastic lesions noted in the visualized portions of the skeleton. IMPRESSION: 1. Status post right lower lobectomy. No findings to suggest local recurrence of disease. 2. Two nonspecific pulmonary nodules near the apex of the left upper lobe, as above. One of these measuring 6 mm has been stable compared a prior examinations, while the other lesion which currently measures 9 x 3 mm (mean diameter of 6 mm) has grown slightly compared to prior studies. Continued attention on future follow-up imaging is recommended. 3. Aortic atherosclerosis, in addition to left main and 3 vessel coronary artery disease. Assessment for potential risk factor modification, dietary therapy or  pharmacologic therapy may be warranted, if clinically indicated. 4. Additional incidental findings, as above. Electronically Signed   By: Vinnie Langton M.D.   On: 06/13/2016 12:46   Ct Chest Wo Contrast  06/20/2015  CLINICAL DATA:  History of right lower lobectomy for lung cancer. Ex-smoker. EXAM: CT CHEST WITHOUT CONTRAST TECHNIQUE: Multidetector CT imaging of the chest was performed following the standard protocol without IV contrast. COMPARISON:  06/09/2014 FINDINGS: Mediastinum/Nodes: No supraclavicular adenopathy. Aortic and branch vessel atherosclerosis. Mild cardiomegaly with lipomatous hypertrophy of the interatrial septum. Multivessel coronary artery  atherosclerosis. No mediastinal or definite hilar adenopathy, given limitations of unenhanced CT. Lungs/Pleura: No pleural fluid. Left-sided anterior pleural lipoma again identified. Foci of right-sided pleural thickening again identified. Right lower lobectomy. Right upper lobe calcified granuloma. Volume loss in the right lung base is similar. Left apical ground-glass and soft tissue nodularity is again identified. Maximally 6 mm (image 9, series 4). Coronal image 70. Subsegmental atelectasis in the dependent left lower lobe. Upper abdomen: Cholecystectomy. Normal imaged portions of the liver, spleen, stomach, pancreas, adrenal glands, kidneys. Musculoskeletal: No acute osseous abnormality. Accentuation of expected thoracic kyphosis IMPRESSION: 1. Status post right lower lobectomy, without recurrent or metastatic disease. 2. Similar left apical ground-glass and soft tissue nodularity. This warrants followup attention but is most likely benign. 3.  Atherosclerosis, including within the coronary arteries. Electronically Signed   By: Abigail Miyamoto M.D.   On: 06/20/2015 14:34   Ct Chest Wo Contrast  06/09/2014   CLINICAL DATA:  Lung cancer.  EXAM: CT CHEST WITHOUT CONTRAST  TECHNIQUE: Multidetector CT imaging of the chest was performed following the standard protocol without IV contrast.  COMPARISON:  04/29/2013 and 04/02/2012.  FINDINGS: No pathologically enlarged mediastinal or axillary lymph nodes. Hilar regions are difficult to definitively evaluate without IV contrast. Atherosclerotic calcification of the arterial vasculature, including three-vessel involvement of the coronary arteries. Heart size normal. No pericardial effusion. Small hiatal hernia.  Postoperative changes of right lower lobectomy with associated scarring and volume loss in the right hemi thorax, stable. Sub cm ground-glass nodular densities in the apical left upper lobe measure up to 7 mm, stable. Extrapleural fat proliferation or lipoma  along the anterolateral margin of the left hemi thorax. No pleural fluid. Airway is otherwise unremarkable.  Incidental imaging of the upper abdomen shows the visualized portions of the liver to be grossly unremarkable. Cholecystectomy. Visualized portions of the adrenal glands and right kidney are unremarkable. Low-attenuation lesion in the upper pole left kidney measures 2.2 cm, likely stable from 04/02/2012, favoring a cyst. Visualized portions of the spleen, pancreas, stomach and bowel are otherwise grossly unremarkable. No upper abdominal adenopathy.  No worrisome lytic or sclerotic lesions. Degenerative changes are seen in the spine. Thoracotomy changes on the right.  IMPRESSION: 1. Postoperative changes of right lower lobectomy without evidence of recurrent or metastatic disease. 2. Left upper lobe ground-glass nodules are unchanged. Continued attention on followup exams is warranted. 3. Extensive 3 vessel coronary artery calcification.   Electronically Signed   By: Lorin Picket M.D.   On: 06/09/2014 12:27      Ct Chest Low Dose Pilot W/o Cm  04/29/2013   *RADIOLOGY REPORT*  Clinical Data: Follow-up lung cancer.  CT CHEST LOW DOSE PILOT WITHOUT CONTRAST  Technique: Multidetector CT imaging of the chest using the standard low-dose protocol without administration of intravenous contrast.  Comparison: Chest CT 04/02/2012  Findings: Low dose technique limits evaluation.  Visualized neck base is unremarkable.  No enlarged  axillary, mediastinal or hilar lymphadenopathy.  Normal heart size.  Coronary artery calcifications.  No pericardial effusion.  Large hiatal hernia.  Central airways are patent.  Minimal ground-glass opacity within the dependent left lower lobe.  Unchanged 7 mm ground-glass opacity within the left upper lobe (image 34; series 3).  Postsurgical change compatible with right lower lobe resection. Unchanged peripheral scarring / postsurgical change within the residual right lung.  No pleural  effusion pneumothorax.  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  Incidental imaging of the upper abdomen demonstrates post cholecystectomy changes.  Normal bilateral adrenal glands.  No aggressive or acute appearing osseous lesions.  IMPRESSION: 1.  Postsurgical change related to prior right lower lobe resection.  No evidence for recurrent or metastatic disease. 2.  Unchanged small ground-glass nodule within the left upper lobe. Attention on follow-up. 3.  Interval development of patchy ground-glass opacities within the left lower lobe which likely represent atelectasis.  Aspiration or infection are considered less likely.   Original Report Authenticated By: Lovey Newcomer, M.D      Recent Lab Findings: Lab Results  Component Value Date   WBC 7.4 06/13/2009   HGB 14.6 06/13/2009   HCT 44.3 06/13/2009   PLT 163.0 06/13/2009   GLUCOSE 106 (H) 06/14/2009   NA 142 06/13/2009   K 4.6 06/13/2009   CL 106 06/13/2009   CREATININE 1.50 (H) 04/22/2018   BUN 17 10/07/2017   CO2 29 06/13/2009   INR 1.1 ratio (H) 06/13/2009      Assessment / Plan:   Patient has appearance of the CT scan of the chest without evidence of recurrence of his adenocarcinoma the lung T1aN0M0 (stage I)  resected 06/28/2009 from the right lung, now almost 9  Years.  He has also status post stereotactic radiotherapy to the left upper lobe lung nodule without a preop biopsy clinically was staged as Stage IA2 cT1bN0M0 NSCLC of the left upper lob completed 08/04/2017. No findings to suggest local recurrence of disease.  Evidence of radiation changes left upper lobe noted-we will follow-up with CT scan in  8  Months-December 2020 I spent in excess of  10 minutes of direct contact time with the patient during the conduct of this telephone.video  virtual office consultation.  Level 1  (99441)             5-10 minutes Level 2  (99442)            11-20 minutes Level 3  (99443)            21-30 minutes  Grace Isaac  MD      [address] [place] [address] Office [phone]   [place]

## 2018-11-12 ENCOUNTER — Other Ambulatory Visit: Payer: Self-pay

## 2018-11-12 ENCOUNTER — Ambulatory Visit
Admission: RE | Admit: 2018-11-12 | Discharge: 2018-11-12 | Disposition: A | Discharge status: Home / Self Care | Payer: Medicare HMO | Source: Ambulatory Visit | Attending: Cardiothoracic Surgery | Admitting: Cardiothoracic Surgery

## 2018-11-12 ENCOUNTER — Telehealth (INDEPENDENT_AMBULATORY_CARE_PROVIDER_SITE_OTHER): Payer: Medicare HMO | Admitting: Cardiothoracic Surgery

## 2018-11-12 DIAGNOSIS — C3431 Malignant neoplasm of lower lobe, right bronchus or lung: Secondary | ICD-10-CM

## 2018-11-12 NOTE — Progress Notes (Signed)
6 month f/u with Chest CT, HX of lung cancer. Brent Mcguire is requesting a telephone call for appt visit. Reviewed all medications

## 2019-06-11 ENCOUNTER — Other Ambulatory Visit: Payer: Self-pay | Admitting: Cardiothoracic Surgery

## 2019-06-11 DIAGNOSIS — C3490 Malignant neoplasm of unspecified part of unspecified bronchus or lung: Secondary | ICD-10-CM

## 2019-07-15 ENCOUNTER — Ambulatory Visit
Admission: RE | Admit: 2019-07-15 | Discharge: 2019-07-15 | Disposition: A | Discharge status: Home / Self Care | Payer: Medicare HMO | Source: Ambulatory Visit | Attending: Cardiothoracic Surgery | Admitting: Cardiothoracic Surgery

## 2019-07-15 ENCOUNTER — Other Ambulatory Visit: Payer: Self-pay

## 2019-07-15 ENCOUNTER — Ambulatory Visit (INDEPENDENT_AMBULATORY_CARE_PROVIDER_SITE_OTHER): Payer: Medicare HMO | Admitting: Cardiothoracic Surgery

## 2019-07-15 DIAGNOSIS — C3490 Malignant neoplasm of unspecified part of unspecified bronchus or lung: Secondary | ICD-10-CM | POA: Diagnosis not present

## 2019-07-15 NOTE — Progress Notes (Signed)
[address]       [place] [address]             [phone]                    Garrison F Kilduff Tillar Medical Record #[id number] Date of Birth: 1934/08/28  Referring: Kyung Rudd, MD Primary Care: [name], MD Telehealth Visit     Virtual Visit via Telephone Note   This visit type was conducted due to national recommendations for restrictions regarding the COVID-19 Pandemic (e.g. social distancing) in an effort to limit this patient's exposure and mitigate transmission in our community.  Due to his co-morbid illnesses, this patient is at least at moderate risk for complications without adequate follow up.  This format is felt to be most appropriate for this patient at this time.  The patient did not have access to video technology/had technical difficulties with video requiring transitioning to audio format only (telephone).  All issues noted in this document were discussed and addressed.  No physical exam could be performed with this format.    Chief Complaint:   POST OP FOLLOW UP Lung Resection previous right lower lobectomy for adenocarcinoma 06/28/2009  History of Present Illness:  I contacted SPYRIDON HORNSTEIN remotely due to the limitations of the current COVID pandemic on 07/15/2019  at  6:39 PM  verifying that I was speaking to [name] whose birthday is [id number].   I discussed limitations of the evaluation and management  of patients remotely without  the benefit of physical exam.  The patient was agreeable with proceeding with a remote/ not face to face visit.      Mr. Curfman  had a CT scan done today of the chest in follow-up on status post radiation treatment to the left upper lobe stereotactic radiotherapy  And resection of right lower lobe After reviewing the CT scan I had a follow-up  call to review the scan results and his overall status.  He had resection of a right lower lobe adenocarcinoma the lung T1aN0M0 (stage 1)resected  06/28/2009.  He is not smoking.  He noted that before the local gym closed he had been going and exercising on a regular basis.  He has known coronary occlusive disease followed by Dr. Frances Nickels but has had no anginal symptoms.   Patient had stereotactic radiotherapy to the left upper lobe, a new highly suspicious lesion that was never biopsied.- radiation completed 08/04/2017  Patient comes in today to review the findings of the recent CT scan, to follow-up on status post radiation treatment to the left upper lobe stereotactic.  Patient denies any new symptoms he has had no respiratory issues, denies angina   Past Medical History:  Diagnosis Date  . Adenocarcinoma ([place]) 07/08/2011   Right Lower Lobe of Lung  . Anemia   . Cholelithiases   . Coronary artery disease   . Diabetes mellitus   . Fatigue   . Foot pain   . Gallstones   . Gastric ulcer   . GI bleed   . Hypercholesteremia   . Hypertension   . Kidney disease   . Lung mass   . Obesity   . Thyroid disease   . UTI (lower urinary tract infection)    Past Surgical History:  Procedure Laterality Date  . CATARACT EXTRACTION    . COLONOSCOPY W/ POLYPECTOMY  2007   3 polyps  . ELBOW SURGERY  pilonidal cyst  . ELECTROMAGNETIC NAVIGATION BROCHOSCOPY  06/28/2009   Dr. Servando Snare  . PILONIDAL CYST EXCISION    . video assisted thoracoscopy, mini thoracotomy,right lower lobectomy with lymph node dissection  06/28/2009   Dr. Servando Snare    Social History   Tobacco Use  Smoking Status Former Smoker  . Types: Cigarettes  . Quit date: 07/29/1993  . Years since quitting: 25.9  Smokeless Tobacco Never Used    Social History   Substance and Sexual Activity  Alcohol Use No     No Known Allergies  Current Outpatient Medications  Medication Sig Dispense Refill  . aspirin 81 MG tablet Take 81 mg by mouth daily.      . carvedilol (COREG) 6.25 MG tablet Take 1 tablet (6.25 mg total) by mouth 2 (two) times daily with a meal. 180 tablet  3  . clopidogrel (PLAVIX) 75 MG tablet Take 1 tablet (75 mg total) by mouth daily. 90 tablet 3  . furosemide (LASIX) 40 MG tablet Take 40 mg by mouth 2 (two) times daily.    Marland Kitchen levothyroxine (SYNTHROID, LEVOTHROID) 175 MCG tablet Take 175 mcg by mouth daily before breakfast.    . Multiple Vitamin (MULTIVITAMIN) tablet Take 1 tablet by mouth daily.      . simvastatin (ZOCOR) 40 MG tablet Take 40 mg by mouth at bedtime.       No current facility-administered medications for this visit.       Physical Exam: There were no vitals taken for this visit. No exam   Diagnostic Studies & Laboratory data:     Recent Radiology Findings CT Chest Wo Contrast  Result Date: 07/15/2019 CLINICAL DATA:  Status post right lower lobectomy for lung cancer in 2012. Status post SBRT completed 08/04/2017 for putative stage I apical left upper lobe lung cancer. Restaging. EXAM: CT CHEST WITHOUT CONTRAST TECHNIQUE: Multidetector CT imaging of the chest was performed following the standard protocol without IV contrast. COMPARISON:  11/12/2018 chest CT. FINDINGS: Cardiovascular: Normal heart size. No significant pericardial effusion/thickening. Three-vessel coronary atherosclerosis. Atherosclerotic nonaneurysmal thoracic aorta. Normal caliber pulmonary arteries. Mediastinum/Nodes: Stable isodense 1.4 cm posterior left upper lobe thyroid nodule, requiring no follow-up. Unremarkable esophagus. No pathologically enlarged axillary, mediastinal or hilar lymph nodes, noting limited sensitivity for the detection of hilar adenopathy on this noncontrast study. Lungs/Pleura: No pneumothorax. No pleural effusion. Stable focal subpleural fat in the anterior mid left pleural space. Status post right lower lobectomy. Stable calcified tiny peripheral right upper lobe granuloma. No acute consolidative airspace disease. Masslike fibrosis in anterior left upper lobe measures 3.3 x 2.3 cm (series 8/image 30) and is associated with volume  loss and distortion, previously 3.3 x 2.3 cm, stable. No new significant pulmonary nodules. Previously described patchy ground-glass opacity in the dependent basilar left lower lobe has decreased. Stable postsurgical scarring at the right lung base. Upper abdomen: Small hiatal hernia. Cholecystectomy clips are seen in the right upper quadrant of the abdomen. Simple 2.6 cm upper left renal cyst. Musculoskeletal: No aggressive appearing focal osseous lesions. Moderate thoracic spondylosis. IMPRESSION: 1. Stable masslike fibrosis in the left upper lobe, with no evidence of local tumor recurrence. 2. No findings of local tumor recurrence status post right lower lobectomy. 3. No evidence of metastatic disease in the chest. 4. Previously described patchy ground-glass opacity in the basilar left lower lobe is decreased, nonspecific, presumably postinflammatory. Aortic Atherosclerosis (ICD10-I70.0). Electronically Signed   By: Ilona Sorrel M.D.   On: 07/15/2019 12:44   I have independently  reviewed the above radiology studies  and reviewed the findings with the patient.   Ct Chest Wo Contrast  Result Date: 11/12/2018 CLINICAL DATA:  84 year old male with history of non-small cell lung cancer. Follow-up study. EXAM: CT CHEST WITHOUT CONTRAST TECHNIQUE: Multidetector CT imaging of the chest was performed following the standard protocol without IV contrast. COMPARISON:  Chest CT 04/22/2018. FINDINGS: Cardiovascular: Heart size is normal. There is no significant pericardial fluid, thickening or pericardial calcification. There is aortic atherosclerosis, as well as atherosclerosis of the great vessels of the mediastinum and the coronary arteries, including calcified atherosclerotic plaque in the left main, left anterior descending, left circumflex and right coronary arteries. Mediastinum/Nodes: No pathologically enlarged mediastinal or hilar lymph nodes. Please note that accurate exclusion of hilar adenopathy is limited  on noncontrast CT scans. Small hiatal hernia. No axillary lymphadenopathy. Lungs/Pleura: Again noted is an area of mass-like architectural distortion in the apex of the left upper lobe which is very similar to the most recent prior study from 04/22/2018, most compatible with an area of postradiation mass-like fibrosis. More subtle peripheral pleuroparenchymal thickening and architectural distortion in the right lung involving portions of both the right middle and lower lobes laterally, similar to prior examinations, most compatible with chronic scarring. A few patchy areas of subtle ground-glass attenuation are noted throughout the lungs, most evident in the left lower lobe, minimally increased compared to the prior examination, but nonspecific. No definite suspicious appearing pulmonary nodules or masses are noted. No acute consolidative airspace disease. No pleural effusions. Smoothly marginated 1.9 x 4.5 cm low-attenuation (-111 HU) lesion in the periphery of the left hemithorax, similar to the prior examination, most compatible with a pleural lipoma. Upper Abdomen: 9 mm high attenuation lesion in the upper pole of the right kidney, incompletely characterized, but favored to represent a proteinaceous/hemorrhagic cyst. 3.0 cm low-attenuation lesion in the upper pole of the left kidney, incompletely characterized on today's noncontrast CT examination, but statistically likely to represent a cyst. Status post cholecystectomy. Aortic atherosclerosis. Musculoskeletal: There are no aggressive appearing lytic or blastic lesions noted in the visualized portions of the skeleton. IMPRESSION: 1. Essentially stable appearance of the thorax on today's examination with chronic postradiation mass-like fibrosis in the apex of the left upper lobe. 2. The exception on today's examination is a small amount of increasing ground-glass attenuation in the base of the left lower lobe which is nonspecific, but attention on follow-up  studies is recommended. 3. Aortic atherosclerosis, in addition to left main and 3 vessel coronary artery disease. 4. Small hiatal hernia. 5. Additional incidental findings, as above. Aortic Atherosclerosis (ICD10-I70.0). Electronically Signed   By: Vinnie Langton M.D.   On: 11/12/2018 09:25   I have independently reviewed the above radiology studies  and reviewed the findings with the patient.  Ct Chest W Contrast  Result Date: 04/23/2018 CLINICAL DATA:  84 year old male with history of adenocarcinoma of the right lung status post radiotherapy. EXAM: CT CHEST WITH CONTRAST TECHNIQUE: Multidetector CT imaging of the chest was performed during intravenous contrast administration. CONTRAST:  31mL OMNIPAQUE IOHEXOL 300 MG/ML  SOLN COMPARISON:  Chest CT 10/07/2017. FINDINGS: Cardiovascular: Heart size is normal. There is no significant pericardial fluid, thickening or pericardial calcification. There is aortic atherosclerosis, as well as atherosclerosis of the great vessels of the mediastinum and the coronary arteries, including calcified atherosclerotic plaque in the left main, left anterior descending, left circumflex and right coronary arteries. Mediastinum/Nodes: Borderline enlarged superior mediastinal lymph node measuring 10 mm in  short axis adjacent to the posterolateral aspect of the proximal trachea on the left side (axial image 15 of series 2), stable compared to prior examinations, nonspecific but presumably benign. No other pathologically enlarged mediastinal or hilar lymph nodes. Small hiatal hernia. No axillary lymphadenopathy. Lungs/Pleura: Status post right lower lobectomy, with compensatory hyperexpansion of the right upper and middle lobes. There is a mass-like area of architectural distortion in the left upper lobe which appears more confluent than the prior examination, most compatible with evolving postradiation mass-like fibrosis. No definite suspicious appearing pulmonary nodules or masses  are noted on today's examination. Mild scarring in the periphery of the right lung. 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 No acute consolidative airspace disease. No pleural effusions. Upper Abdomen: Status post cholecystectomy. Aortic atherosclerosis. Low-attenuation lesions in the kidneys bilaterally, largest of which are compatible with simple cysts measuring up to 2.8 cm in the upper pole the left kidney. The other smaller low-attenuation lesions are too small to definitively characterize, but statistically likely to represent tiny cysts. Musculoskeletal: There are no aggressive appearing lytic or blastic lesions noted in the visualized portions of the skeleton. IMPRESSION: 1. Evolving postradiation changes near the apex of the left upper lobe, most compatible with developing postradiation mass-like fibrosis. No suspicious findings to suggest metastatic disease on today's examination. 2. Aortic atherosclerosis, in addition to left main and 3 vessel coronary artery disease. 3. Hepatic steatosis. 4. Additional incidental findings, as above. Aortic Atherosclerosis (ICD10-I70.0). Electronically Signed   By: Vinnie Langton M.D.   On: 04/23/2018 09:45    Nm Pet Image Restag (ps) Skull Base To Thigh  Result Date: 07/01/2017 CLINICAL DATA:  Initial treatment strategy for left upper lobe nodule. Previous right lower lobectomy for lung carcinoma. EXAM: NUCLEAR MEDICINE PET SKULL BASE TO THIGH TECHNIQUE: 13.4 mCi F-18 FDG was injected intravenously. Full-ring PET imaging was performed from the skull base to thigh after the radiotracer. CT data was obtained and used for attenuation correction and anatomic localization. FASTING BLOOD GLUCOSE:  Value: 88 mg/dl COMPARISON:  CT on 06/05/2017 and PET-CT on 12/30/2008 FINDINGS:  NECK:  No hypermetabolic lymph nodes or masses. CHEST: No hypermetabolic lymphadenopathy. 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 Postsurgical changes from right lower lobectomy, with scarring showing mild FDG uptake along the right lateral chest wall. A 1.7 cm irregular pulmonary nodule is seen in the medial left lung apex which was not seen on 2010 exam, and shows mild FDG uptake with SUV max of 3.1. ABDOMEN/PELVIS: No abnormal hypermetabolic activity within the liver, pancreas, adrenal glands, or spleen. No hypermetabolic lymph nodes in the abdomen or pelvis. Small hiatal hernia is seen. Mild to moderate prostate enlargement. Aortic atherosclerosis. Left-sided colonic diverticulosis, without evidence of diverticulitis. SKELETON: No focal hypermetabolic bone lesions to suggest skeletal metastasis. IMPRESSION: 1.7 cm irregular pulmonary nodule in medial left lung apex is new since 2010, and shows low-grade metabolic activity with SUV max of 3.1. Differential diagnosis includes low-grade carcinoma and inflammatory/infectious etiologies. No evidence of thoracic nodal or distant metastatic disease. Electronically Signed   By: Earle Gell M.D.   On: 07/01/2017 17:03    Ct Chest Nodule Follow Up Low Dose W/o  Result Date: 06/13/2016 CLINICAL DATA:  84 year old male with history of right lower lobe lung cancer status post lobectomy in 2010.  Followup study. EXAM: CT CHEST WITHOUT CONTRAST TECHNIQUE: Multidetector CT imaging of the chest was performed following the standard protocol without IV contrast. COMPARISON:  Chest CT 06/20/2015. FINDINGS: Cardiovascular: Heart size is normal. There is no significant pericardial fluid, thickening or pericardial calcification. There is aortic atherosclerosis, as well as atherosclerosis of the great vessels of the mediastinum and the coronary arteries, including calcified atherosclerotic plaque in the left main, left anterior descending, left circumflex and  right coronary arteries. Mediastinum/Nodes: No pathologically enlarged mediastinal or hilar lymph nodes. Please note that accurate exclusion of hilar adenopathy is limited on noncontrast CT scans. Small hiatal hernia. No axillary lymphadenopathy. Lungs/Pleura: Status post right lower lobectomy with compensatory hyperexpansion of the right middle and upper lobes. Small left upper lobe nodule near the apex measuring 6 mm (image 21 of series 4) is unchanged. There is an adjacent 9 x 3 mm nodule (mean diameter of 6 mm) in the left upper lobe (image 24 of series 4) which appears slightly larger than the prior examination. No other suspicious appearing pulmonary nodules or masses are noted. Fatty attenuation lesion in the lower left hemithorax laterally is similar to prior studies, most compatible with a pleural lipoma. No acute consolidative airspace disease. No pleural effusions. Tiny calcified granuloma in the periphery of the right upper lobe incidentally noted. Upper Abdomen: Aortic atherosclerosis.  Status post cholecystectomy. Musculoskeletal: There are no aggressive appearing lytic or blastic lesions noted in the visualized portions of the skeleton. IMPRESSION: 1. Status post right lower lobectomy. No findings to suggest local recurrence of disease. 2. Two nonspecific pulmonary nodules near the apex of the left upper lobe, as above. One of these measuring 6 mm has been stable compared a prior examinations, while the other lesion which currently measures 9 x 3 mm (mean diameter of 6 mm) has grown slightly compared to prior studies. Continued attention on future follow-up imaging is recommended. 3. Aortic atherosclerosis, in addition to left main and 3 vessel coronary artery disease. Assessment for potential risk factor modification, dietary therapy or pharmacologic therapy may be warranted, if clinically indicated. 4. Additional incidental findings, as above. Electronically Signed   By: Vinnie Langton M.D.   On:  06/13/2016 12:46   Ct Chest Wo Contrast  06/20/2015  CLINICAL DATA:  History of right lower lobectomy for lung cancer. Ex-smoker. EXAM: CT CHEST WITHOUT CONTRAST TECHNIQUE: Multidetector CT imaging of the chest was performed following the standard protocol without IV contrast. COMPARISON:  06/09/2014 FINDINGS: Mediastinum/Nodes: No supraclavicular adenopathy. Aortic and branch vessel atherosclerosis. Mild cardiomegaly with lipomatous hypertrophy of the interatrial septum. Multivessel coronary artery atherosclerosis. No mediastinal or definite hilar adenopathy, given limitations of unenhanced CT. Lungs/Pleura: No pleural fluid. Left-sided anterior pleural lipoma again identified. Foci of right-sided pleural thickening again identified. Right lower lobectomy. Right upper lobe calcified granuloma. Volume loss in the right lung base is similar. Left apical ground-glass and soft tissue nodularity is again identified. Maximally 6 mm (image 9, series 4). Coronal image 70. Subsegmental atelectasis in the dependent left lower lobe. Upper abdomen: Cholecystectomy. Normal imaged portions of the liver, spleen, stomach, pancreas, adrenal glands, kidneys. Musculoskeletal: No acute osseous abnormality. Accentuation of expected thoracic kyphosis IMPRESSION: 1. Status post right lower lobectomy, without recurrent or metastatic disease. 2. Similar left apical ground-glass and soft tissue nodularity. This warrants followup attention but is most likely benign. 3.  Atherosclerosis, including within the coronary arteries. Electronically Signed   By: Abigail Miyamoto M.D.   On: 06/20/2015 14:34   Ct  Chest Wo Contrast  06/09/2014   CLINICAL DATA:  Lung cancer.  EXAM: CT CHEST WITHOUT CONTRAST  TECHNIQUE: Multidetector CT imaging of the chest was performed following the standard protocol without IV contrast.  COMPARISON:  04/29/2013 and 04/02/2012.  FINDINGS: No pathologically enlarged mediastinal or axillary lymph nodes. Hilar regions  are difficult to definitively evaluate without IV contrast. Atherosclerotic calcification of the arterial vasculature, including three-vessel involvement of the coronary arteries. Heart size normal. No pericardial effusion. Small hiatal hernia.  Postoperative changes of right lower lobectomy with associated scarring and volume loss in the right hemi thorax, stable. Sub cm ground-glass nodular densities in the apical left upper lobe measure up to 7 mm, stable. Extrapleural fat proliferation or lipoma along the anterolateral margin of the left hemi thorax. No pleural fluid. Airway is otherwise unremarkable.  Incidental imaging of the upper abdomen shows the visualized portions of the liver to be grossly unremarkable. Cholecystectomy. Visualized portions of the adrenal glands and right kidney are unremarkable. Low-attenuation lesion in the upper pole left kidney measures 2.2 cm, likely stable from 04/02/2012, favoring a cyst. Visualized portions of the spleen, pancreas, stomach and bowel are otherwise grossly unremarkable. No upper abdominal adenopathy.  No worrisome lytic or sclerotic lesions. Degenerative changes are seen in the spine. Thoracotomy changes on the right.  IMPRESSION: 1. Postoperative changes of right lower lobectomy without evidence of recurrent or metastatic disease. 2. Left upper lobe ground-glass nodules are unchanged. Continued attention on followup exams is warranted. 3. Extensive 3 vessel coronary artery calcification.   Electronically Signed   By: Lorin Picket M.D.   On: 06/09/2014 12:27      Ct Chest Low Dose Pilot W/o Cm  04/29/2013   *RADIOLOGY REPORT*  Clinical Data: Follow-up lung cancer.  CT CHEST LOW DOSE PILOT WITHOUT CONTRAST  Technique: Multidetector CT imaging of the chest using the standard low-dose protocol without administration of intravenous contrast.  Comparison: Chest CT 04/02/2012  Findings: Low dose technique limits evaluation.  Visualized neck base is unremarkable.   No enlarged axillary, mediastinal or hilar lymphadenopathy.  Normal heart size.  Coronary artery calcifications.  No pericardial effusion.  Large hiatal hernia.  Central airways are patent.  Minimal ground-glass opacity within the dependent left lower lobe.  Unchanged 7 mm ground-glass opacity within the left upper lobe (image 34; series 3).  Postsurgical change compatible with right lower lobe resection. Unchanged peripheral scarring / postsurgical change within the residual right lung.  No pleural effusion pneumothorax.  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  Incidental imaging of the upper abdomen demonstrates post cholecystectomy changes.  Normal bilateral adrenal glands.  No aggressive or acute appearing osseous lesions.  IMPRESSION: 1.  Postsurgical change related to prior right lower lobe resection.  No evidence for recurrent or metastatic disease. 2.  Unchanged small ground-glass nodule within the left upper lobe. Attention on follow-up. 3.  Interval development of patchy ground-glass opacities within the left lower lobe which likely represent atelectasis.  Aspiration or infection are considered less likely.   Original Report Authenticated By: Lovey Newcomer, M.D      Recent Lab Findings: Lab Results  Component Value Date   WBC 7.4 06/13/2009   HGB 14.6 06/13/2009   HCT 44.3 06/13/2009   PLT 163.0 06/13/2009   GLUCOSE 106 (H) 06/14/2009   NA 142 06/13/2009   K 4.6 06/13/2009   CL 106 06/13/2009   CREATININE 1.50 (H) 04/22/2018   BUN 17 10/07/2017   CO2 29 06/13/2009  INR 1.1 ratio (H) 06/13/2009      Assessment / Plan:   Patient  Had follow up ct of chest   without evidence of recurrence of his adenocarcinoma the lung T1aN0M0 (stage I)  resected 06/28/2009 from the right lung, now 10 Years.  He has also status post stereotactic radiotherapy to the left upper lobe lung nodule without a preop biopsy clinically was staged as Stage IA2 cT1bN0M0 NSCLC of the left upper lob  completed 08/04/2017. No findings to suggest local recurrence of disease.   Evidence of radiation changes left upper lobe noted-we will follow-up with CT scan in 7-  8  Months- July 2021   Grace Isaac MD      [address] ,[place] [address] Office [phone]   [place]

## 2019-10-12 IMAGING — PT NM PET TUM IMG RESTAG (PS) SKULL BASE T - THIGH
1 of 8 series · 1 of 25 positions shown · non-contrast
Comparison: CT on 06/05/2017 and PET-CT on 12/30/2008

CLINICAL DATA: Initial treatment strategy for left upper lobe
nodule. Previous right lower lobectomy for lung carcinoma.

EXAM:
NUCLEAR MEDICINE PET SKULL BASE TO THIGH
TECHNIQUE: 13.4 mCi F-18 FDG was injected intravenously. Full-ring PET imaging
was performed from the skull base to thigh after the radiotracer. CT
data was obtained and used for attenuation correction and anatomic
localization.
FASTING BLOOD GLUCOSE:  Value: 88 mg/dl

[Series 4: ct sk_thigh 5.0 hd_fov · axial · 5.0mm · 1.17mm/px · 1 of 240 slices shown]
[im 240/240  brain]
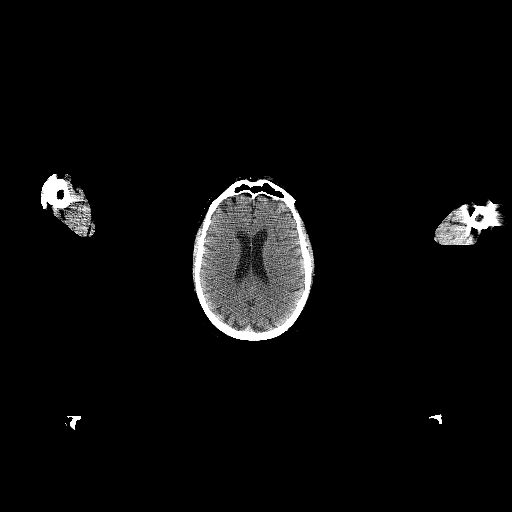

[1 of 25 positions shown; findings below may reference images not displayed]

FINDINGS: NECK:  No hypermetabolic lymph nodes or masses.

CHEST: No hypermetabolic lymphadenopathy. Stable pleural lipoma
along the left lateral chest wall.

Postsurgical changes from right lower lobectomy, with scarring
showing mild FDG uptake along the right lateral chest wall. A 1.7 cm
irregular pulmonary nodule is seen in the medial left lung apex
which was not seen on 8999 exam, and shows mild FDG uptake with SUV
max of 3.1.

ABDOMEN/PELVIS: No abnormal hypermetabolic activity within the
liver, pancreas, adrenal glands, or spleen. No hypermetabolic lymph
nodes in the abdomen or pelvis.

Small hiatal hernia is seen. Mild to moderate prostate enlargement.
Aortic atherosclerosis. Left-sided colonic diverticulosis, without
evidence of diverticulitis.

SKELETON: No focal hypermetabolic bone lesions to suggest skeletal
metastasis.
IMPRESSION: 1.7 cm irregular pulmonary nodule in medial left lung apex is new
since 8999, and shows low-grade metabolic activity with SUV max of
3.1. Differential diagnosis includes low-grade carcinoma and
inflammatory/infectious etiologies.

No evidence of thoracic nodal or distant metastatic disease.

## 2020-01-11 ENCOUNTER — Other Ambulatory Visit: Payer: Self-pay | Admitting: *Deleted

## 2020-01-11 DIAGNOSIS — R911 Solitary pulmonary nodule: Secondary | ICD-10-CM

## 2020-01-25 ENCOUNTER — Ambulatory Visit
Admission: RE | Admit: 2020-01-25 | Discharge: 2020-01-25 | Disposition: A | Discharge status: Home / Self Care | Payer: Medicare HMO | Source: Ambulatory Visit | Attending: Cardiothoracic Surgery | Admitting: Cardiothoracic Surgery

## 2020-01-25 ENCOUNTER — Other Ambulatory Visit: Payer: Self-pay

## 2020-01-25 DIAGNOSIS — R911 Solitary pulmonary nodule: Secondary | ICD-10-CM

## 2020-02-24 ENCOUNTER — Other Ambulatory Visit: Payer: Self-pay

## 2020-02-24 ENCOUNTER — Ambulatory Visit: Payer: Medicare HMO | Admitting: Cardiothoracic Surgery

## 2020-02-24 VITALS — BP 136/70 | HR 75 | Temp 98.6°F | Resp 20 | Ht 71.0 in | Wt 259.0 lb

## 2020-02-24 DIAGNOSIS — Z85118 Personal history of other malignant neoplasm of bronchus and lung: Secondary | ICD-10-CM | POA: Diagnosis not present

## 2020-02-24 DIAGNOSIS — R911 Solitary pulmonary nodule: Secondary | ICD-10-CM

## 2020-02-24 NOTE — Progress Notes (Addendum)
[address]       [place], [address]             [phone]                    Brent Mcguire [place] Medical Record #[id number] Date of Birth: 1934-09-15  Referring: Kyung Rudd, MD Primary Care: [name], MD   Chief Complaint:   POST OP FOLLOW UP Lung Resection previous right lower lobectomy for adenocarcinoma 06/28/2009  History of Present Illness:      Brent Mcguire  had a CT scan done  3 weeks ago for  follow-up on status post radiation treatment to the left upper lobe stereotactic radiotherapy  And resection of right lower lobe.  .  He had resection of a right lower lobe adenocarcinoma the lung [id number] (stage 1)resected 06/28/2009.  He is not smoking.   Patient had stereotactic radiotherapy to the left upper lobe, a new highly suspicious lesion that was never biopsied.- radiation completed 08/04/2017  Patient comes in today to review the findings of the recent CT scan, to follow-up on status post radiation treatment to the left upper lobe stereotactic.  Patient denies any new symptoms he has had no respiratory issues, denies angina  In February of this year he had Covid vaccination x2 through the [place]  Past Medical History:  Diagnosis Date  . Adenocarcinoma ([place]) 07/08/2011   Right Lower Lobe of Lung  . Anemia   . Cholelithiases   . Coronary artery disease   . Diabetes mellitus   . Fatigue   . Foot pain   . Gallstones   . Gastric ulcer   . GI bleed   . Hypercholesteremia   . Hypertension   . Kidney disease   . Lung mass   . Obesity   . Thyroid disease   . UTI (lower urinary tract infection)    Past Surgical History:  Procedure Laterality Date  . CATARACT EXTRACTION    . COLONOSCOPY W/ POLYPECTOMY  2007   3 polyps  . ELBOW SURGERY     pilonidal cyst  . ELECTROMAGNETIC NAVIGATION BROCHOSCOPY  06/28/2009   Dr. Servando Snare  . PILONIDAL CYST EXCISION    . video assisted thoracoscopy, mini thoracotomy,right lower lobectomy with lymph  node dissection  06/28/2009   Dr. Servando Snare    Social History   Tobacco Use  Smoking Status Former Smoker  . Types: Cigarettes  . Quit date: 07/29/1993  . Years since quitting: 26.5  Smokeless Tobacco Never Used    Social History   Substance and Sexual Activity  Alcohol Use No     Allergies  Allergen Reactions  . Gemfibrozil Other (See Comments)    Current Outpatient Medications  Medication Sig Dispense Refill  . aspirin 81 MG tablet Take 81 mg by mouth daily.      . carvedilol (COREG) 6.25 MG tablet Take 1 tablet (6.25 mg total) by mouth 2 (two) times daily with a meal. 180 tablet 3  . clopidogrel (PLAVIX) 75 MG tablet Take 1 tablet (75 mg total) by mouth daily. 90 tablet 3  . furosemide (LASIX) 40 MG tablet Take 40 mg by mouth 2 (two) times daily.    Marland Kitchen levothyroxine (SYNTHROID, LEVOTHROID) 175 MCG tablet Take 175 mcg by mouth daily before breakfast.    . Multiple Vitamin (MULTIVITAMIN) tablet Take 1 tablet by mouth daily.      . simvastatin (ZOCOR) 40 MG tablet  Take 40 mg by mouth at bedtime.       No current facility-administered medications for this visit.       Physical Exam: Ht 5\' 11"  (1.803 m)   BMI 37.66 kg/m  No exam General appearance: alert, cooperative and no distress Head: Normocephalic, without obvious abnormality, atraumatic Neck: no adenopathy, no carotid bruit, no JVD, supple, symmetrical, trachea midline and thyroid not enlarged, symmetric, no tenderness/mass/nodules Lymph nodes: Cervical, supraclavicular, and axillary nodes normal. Resp: clear to auscultation bilaterally Cardio: regular rate and rhythm, S1, S2 normal, no murmur, click, rub or gallop GI: soft, non-tender; bowel sounds normal; no masses,  no organomegaly Extremities: extremities normal, atraumatic, no cyanosis or edema and Homans sign is negative, no sign of DVT Neurologic: Grossly normal   Diagnostic Studies & Laboratory data:     Recent Radiology Findings CT CHEST WO  CONTRAST  Result Date: 01/25/2020 CLINICAL DATA:  Follow-up lung cancer, status post right lower lobectomy two thousand twelve, status post left upper lobe SP RT two thousand nineteen. EXAM: CT CHEST WITHOUT CONTRAST TECHNIQUE: Multidetector CT imaging of the chest was performed following the standard protocol without IV contrast. COMPARISON:  07/15/2019, 11/12/2018 FINDINGS: Cardiovascular: Aortic atherosclerosis. Normal heart size. No pericardial effusion. Mediastinum/Nodes: No enlarged mediastinal, hilar, or axillary lymph nodes. Small hiatal hernia. Thyroid gland, trachea, and esophagus demonstrate no significant findings. Lungs/Pleura: Redemonstrated postoperative findings of right lower lobectomy with associated scarring and volume loss. Stable irregular post treatment opacity of the anterior left pulmonary apex (series 3, image 33). No pleural effusion or pneumothorax. Subpleural fat deposition or lipoma over the lateral lingula (series 2, image 98). Upper Abdomen: No acute abnormality. Musculoskeletal: No chest wall mass or suspicious bone lesions identified. IMPRESSION: 1. Redemonstrated postoperative findings of right lower lobectomy with associated scarring and volume loss. 2. Stable post radiation appearance of the anterior left pulmonary apex. 3. No evidence of malignant recurrence or metastatic disease in the chest. 4. Aortic Atherosclerosis (ICD10-I70.0). Electronically Signed   By: Eddie Candle M.D.   On: 01/25/2020 09:00  I have independently reviewed the above radiology studies  and reviewed the findings with the patient.   Ct Chest Wo Contrast  Result Date: 11/12/2018 CLINICAL DATA:  85 year old male with history of non-small cell lung cancer. Follow-up study. EXAM: CT CHEST WITHOUT CONTRAST TECHNIQUE: Multidetector CT imaging of the chest was performed following the standard protocol without IV contrast. COMPARISON:  Chest CT 04/22/2018. FINDINGS: Cardiovascular: Heart size is normal.  There is no significant pericardial fluid, thickening or pericardial calcification. There is aortic atherosclerosis, as well as atherosclerosis of the great vessels of the mediastinum and the coronary arteries, including calcified atherosclerotic plaque in the left main, left anterior descending, left circumflex and right coronary arteries. Mediastinum/Nodes: No pathologically enlarged mediastinal or hilar lymph nodes. Please note that accurate exclusion of hilar adenopathy is limited on noncontrast CT scans. Small hiatal hernia. No axillary lymphadenopathy. Lungs/Pleura: Again noted is an area of mass-like architectural distortion in the apex of the left upper lobe which is very similar to the most recent prior study from 04/22/2018, most compatible with an area of postradiation mass-like fibrosis. More subtle peripheral pleuroparenchymal thickening and architectural distortion in the right lung involving portions of both the right middle and lower lobes laterally, similar to prior examinations, most compatible with chronic scarring. A few patchy areas of subtle ground-glass attenuation are noted throughout the lungs, most evident in the left lower lobe, minimally increased compared to the prior examination, but  nonspecific. No definite suspicious appearing pulmonary nodules or masses are noted. No acute consolidative airspace disease. No pleural effusions. Smoothly marginated 1.9 x 4.5 cm low-attenuation (-111 HU) lesion in the periphery of the left hemithorax, similar to the prior examination, most compatible with a pleural lipoma. Upper Abdomen: 9 mm high attenuation lesion in the upper pole of the right kidney, incompletely characterized, but favored to represent a proteinaceous/hemorrhagic cyst. 3.0 cm low-attenuation lesion in the upper pole of the left kidney, incompletely characterized on today's noncontrast CT examination, but statistically likely to represent a cyst. Status post cholecystectomy. Aortic  atherosclerosis. Musculoskeletal: There are no aggressive appearing lytic or blastic lesions noted in the visualized portions of the skeleton. IMPRESSION: 1. Essentially stable appearance of the thorax on today's examination with chronic postradiation mass-like fibrosis in the apex of the left upper lobe. 2. The exception on today's examination is a small amount of increasing ground-glass attenuation in the base of the left lower lobe which is nonspecific, but attention on follow-up studies is recommended. 3. Aortic atherosclerosis, in addition to left main and 3 vessel coronary artery disease. 4. Small hiatal hernia. 5. Additional incidental findings, as above. Aortic Atherosclerosis (ICD10-I70.0). Electronically Signed   By: Vinnie Langton M.D.   On: 11/12/2018 09:25    Ct Chest W Contrast  Result Date: 04/23/2018 CLINICAL DATA:  85 year old male with history of adenocarcinoma of the right lung status post radiotherapy. EXAM: CT CHEST WITH CONTRAST TECHNIQUE: Multidetector CT imaging of the chest was performed during intravenous contrast administration. CONTRAST:  81mL OMNIPAQUE IOHEXOL 300 MG/ML  SOLN COMPARISON:  Chest CT 10/07/2017. FINDINGS: Cardiovascular: Heart size is normal. There is no significant pericardial fluid, thickening or pericardial calcification. There is aortic atherosclerosis, as well as atherosclerosis of the great vessels of the mediastinum and the coronary arteries, including calcified atherosclerotic plaque in the left main, left anterior descending, left circumflex and right coronary arteries. Mediastinum/Nodes: Borderline enlarged superior mediastinal lymph node measuring 10 mm in short axis adjacent to the posterolateral aspect of the proximal trachea on the left side (axial image 15 of series 2), stable compared to prior examinations, nonspecific but presumably benign. No other pathologically enlarged mediastinal or hilar lymph nodes. Small hiatal hernia. No axillary  lymphadenopathy. Lungs/Pleura: Status post right lower lobectomy, with compensatory hyperexpansion of the right upper and middle lobes. There is a mass-like area of architectural distortion in the left upper lobe which appears more confluent than the prior examination, most compatible with evolving postradiation mass-like fibrosis. No definite suspicious appearing pulmonary nodules or masses are noted on today's examination. Mild scarring in the periphery of the right lung. 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 No acute consolidative airspace disease. No pleural effusions. Upper Abdomen: Status post cholecystectomy. Aortic atherosclerosis. Low-attenuation lesions in the kidneys bilaterally, largest of which are compatible with simple cysts measuring up to 2.8 cm in the upper pole the left kidney. The other smaller low-attenuation lesions are too small to definitively characterize, but statistically likely to represent tiny cysts. Musculoskeletal: There are no aggressive appearing lytic or blastic lesions noted in the visualized portions of the skeleton. IMPRESSION: 1. Evolving postradiation changes near the apex of the left upper lobe, most compatible with developing postradiation mass-like fibrosis. No suspicious findings to suggest metastatic disease on today's examination. 2. Aortic atherosclerosis, in  addition to left main and 3 vessel coronary artery disease. 3. Hepatic steatosis. 4. Additional incidental findings, as above. Aortic Atherosclerosis (ICD10-I70.0). Electronically Signed   By: Vinnie Langton M.D.   On: 04/23/2018 09:45    Nm Pet Image Restag (ps) Skull Base To Thigh  Result Date: 07/01/2017 CLINICAL DATA:  Initial treatment strategy for left upper lobe nodule. Previous right lower lobectomy for lung  carcinoma. EXAM: NUCLEAR MEDICINE PET SKULL BASE TO THIGH TECHNIQUE: 13.4 mCi F-18 FDG was injected intravenously. Full-ring PET imaging was performed from the skull base to thigh after the radiotracer. CT data was obtained and used for attenuation correction and anatomic localization. FASTING BLOOD GLUCOSE:  Value: 88 mg/dl COMPARISON:  CT on 06/05/2017 and PET-CT on 12/30/2008 FINDINGS: NECK:  No hypermetabolic lymph nodes or masses. CHEST: No hypermetabolic lymphadenopathy. 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 Postsurgical changes from right lower lobectomy, with scarring showing mild FDG uptake along the right lateral chest wall. A 1.7 cm irregular pulmonary nodule is seen in the medial left lung apex which was not seen on 2010 exam, and shows mild FDG uptake with SUV max of 3.1. ABDOMEN/PELVIS: No abnormal hypermetabolic activity within the liver, pancreas, adrenal glands, or spleen. No hypermetabolic lymph nodes in the abdomen or pelvis. Small hiatal hernia is seen. Mild to moderate prostate enlargement. Aortic atherosclerosis. Left-sided colonic diverticulosis, without evidence of diverticulitis. SKELETON: No focal hypermetabolic bone lesions to suggest skeletal metastasis. IMPRESSION: 1.7 cm irregular pulmonary nodule in medial left lung apex is new since 2010, and shows low-grade metabolic activity with SUV max of 3.1. Differential diagnosis includes low-grade carcinoma and inflammatory/infectious etiologies. No evidence of thoracic nodal or distant metastatic disease. Electronically Signed   By: Earle Gell M.D.   On: 07/01/2017 17:03    Ct Chest Nodule Follow Up Low Dose W/o  Result Date: 06/13/2016 CLINICAL DATA:  85 year old male with history of right lower lobe lung cancer status post lobectomy in 2010. Followup study. EXAM: CT CHEST WITHOUT CONTRAST TECHNIQUE: Multidetector CT imaging of the chest was performed following the standard protocol without IV contrast.  COMPARISON:  Chest CT 06/20/2015. FINDINGS: Cardiovascular: Heart size is normal. There is no significant pericardial fluid, thickening or pericardial calcification. There is aortic atherosclerosis, as well as atherosclerosis of the great vessels of the mediastinum and the coronary arteries, including calcified atherosclerotic plaque in the left main, left anterior descending, left circumflex and right coronary arteries. Mediastinum/Nodes: No pathologically enlarged mediastinal or hilar lymph nodes. Please note that accurate exclusion of hilar adenopathy is limited on noncontrast CT scans. Small hiatal hernia. No axillary lymphadenopathy. Lungs/Pleura: Status post right lower lobectomy with compensatory hyperexpansion of the right middle and upper lobes. Small left upper lobe nodule near the apex measuring 6 mm (image 21 of series 4) is unchanged. There is an adjacent 9 x 3 mm nodule (mean diameter of 6 mm) in the left upper lobe (image 24 of series 4) which appears slightly larger than the prior examination. No other suspicious appearing pulmonary nodules or masses are noted. Fatty attenuation lesion in the lower left hemithorax laterally is similar to prior studies, most compatible with a pleural lipoma. No acute consolidative airspace disease. No pleural effusions. Tiny calcified granuloma in the periphery of the right upper lobe incidentally noted. Upper Abdomen: Aortic atherosclerosis.  Status post cholecystectomy. Musculoskeletal: There are no aggressive appearing lytic or blastic lesions noted in the visualized portions of the skeleton. IMPRESSION: 1. Status post right lower lobectomy.  No findings to suggest local recurrence of disease. 2. Two nonspecific pulmonary nodules near the apex of the left upper lobe, as above. One of these measuring 6 mm has been stable compared a prior examinations, while the other lesion which currently measures 9 x 3 mm (mean diameter of 6 mm) has grown slightly compared to prior  studies. Continued attention on future follow-up imaging is recommended. 3. Aortic atherosclerosis, in addition to left main and 3 vessel coronary artery disease. Assessment for potential risk factor modification, dietary therapy or pharmacologic therapy may be warranted, if clinically indicated. 4. Additional incidental findings, as above. Electronically Signed   By: Vinnie Langton M.D.   On: 06/13/2016 12:46   Ct Chest Wo Contrast  06/20/2015  CLINICAL DATA:  History of right lower lobectomy for lung cancer. Ex-smoker. EXAM: CT CHEST WITHOUT CONTRAST TECHNIQUE: Multidetector CT imaging of the chest was performed following the standard protocol without IV contrast. COMPARISON:  06/09/2014 FINDINGS: Mediastinum/Nodes: No supraclavicular adenopathy. Aortic and branch vessel atherosclerosis. Mild cardiomegaly with lipomatous hypertrophy of the interatrial septum. Multivessel coronary artery atherosclerosis. No mediastinal or definite hilar adenopathy, given limitations of unenhanced CT. Lungs/Pleura: No pleural fluid. Left-sided anterior pleural lipoma again identified. Foci of right-sided pleural thickening again identified. Right lower lobectomy. Right upper lobe calcified granuloma. Volume loss in the right lung base is similar. Left apical ground-glass and soft tissue nodularity is again identified. Maximally 6 mm (image 9, series 4). Coronal image 70. Subsegmental atelectasis in the dependent left lower lobe. Upper abdomen: Cholecystectomy. Normal imaged portions of the liver, spleen, stomach, pancreas, adrenal glands, kidneys. Musculoskeletal: No acute osseous abnormality. Accentuation of expected thoracic kyphosis IMPRESSION: 1. Status post right lower lobectomy, without recurrent or metastatic disease. 2. Similar left apical ground-glass and soft tissue nodularity. This warrants followup attention but is most likely benign. 3.  Atherosclerosis, including within the coronary arteries. Electronically  Signed   By: Abigail Miyamoto M.D.   On: 06/20/2015 14:34   Ct Chest Wo Contrast  06/09/2014   CLINICAL DATA:  Lung cancer.  EXAM: CT CHEST WITHOUT CONTRAST  TECHNIQUE: Multidetector CT imaging of the chest was performed following the standard protocol without IV contrast.  COMPARISON:  04/29/2013 and 04/02/2012.  FINDINGS: No pathologically enlarged mediastinal or axillary lymph nodes. Hilar regions are difficult to definitively evaluate without IV contrast. Atherosclerotic calcification of the arterial vasculature, including three-vessel involvement of the coronary arteries. Heart size normal. No pericardial effusion. Small hiatal hernia.  Postoperative changes of right lower lobectomy with associated scarring and volume loss in the right hemi thorax, stable. Sub cm ground-glass nodular densities in the apical left upper lobe measure up to 7 mm, stable. Extrapleural fat proliferation or lipoma along the anterolateral margin of the left hemi thorax. No pleural fluid. Airway is otherwise unremarkable.  Incidental imaging of the upper abdomen shows the visualized portions of the liver to be grossly unremarkable. Cholecystectomy. Visualized portions of the adrenal glands and right kidney are unremarkable. Low-attenuation lesion in the upper pole left kidney measures 2.2 cm, likely stable from 04/02/2012, favoring a cyst. Visualized portions of the spleen, pancreas, stomach and bowel are otherwise grossly unremarkable. No upper abdominal adenopathy.  No worrisome lytic or sclerotic lesions. Degenerative changes are seen in the spine. Thoracotomy changes on the right.  IMPRESSION: 1. Postoperative changes of right lower lobectomy without evidence of recurrent or metastatic disease. 2. Left upper lobe ground-glass nodules are unchanged. Continued attention on followup exams is warranted. 3. Extensive 3 vessel  coronary artery calcification.   Electronically Signed   By: Lorin Picket M.D.   On: 06/09/2014 12:27       Ct Chest Low Dose Pilot W/o Cm  04/29/2013   *RADIOLOGY REPORT*  Clinical Data: Follow-up lung cancer.  CT CHEST LOW DOSE PILOT WITHOUT CONTRAST  Technique: Multidetector CT imaging of the chest using the standard low-dose protocol without administration of intravenous contrast.  Comparison: Chest CT 04/02/2012  Findings: Low dose technique limits evaluation.  Visualized neck base is unremarkable.  No enlarged axillary, mediastinal or hilar lymphadenopathy.  Normal heart size.  Coronary artery calcifications.  No pericardial effusion.  Large hiatal hernia.  Central airways are patent.  Minimal ground-glass opacity within the dependent left lower lobe.  Unchanged 7 mm ground-glass opacity within the left upper lobe (image 34; series 3).  Postsurgical change compatible with right lower lobe resection. Unchanged peripheral scarring / postsurgical change within the residual right lung.  No pleural effusion pneumothorax.  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  Incidental imaging of the upper abdomen demonstrates post cholecystectomy changes.  Normal bilateral adrenal glands.  No aggressive or acute appearing osseous lesions.  IMPRESSION: 1.  Postsurgical change related to prior right lower lobe resection.  No evidence for recurrent or metastatic disease. 2.  Unchanged small ground-glass nodule within the left upper lobe. Attention on follow-up. 3.  Interval development of patchy ground-glass opacities within the left lower lobe which likely represent atelectasis.  Aspiration or infection are considered less likely.   Original Report Authenticated By: Lovey Newcomer, M.D      Recent Lab Findings: Lab Results  Component Value Date   WBC 7.4 06/13/2009   HGB 14.6 06/13/2009   HCT 44.3 06/13/2009   PLT 163.0 06/13/2009   GLUCOSE 106 (H) 06/14/2009   NA 142 06/13/2009   K 4.6 06/13/2009   CL 106 06/13/2009   CREATININE 1.50 (H) 04/22/2018   BUN 17 10/07/2017   CO2 29 06/13/2009   INR 1.1 ratio  (H) 06/13/2009      Assessment / Plan:   #1 follow-up CT scan after previous treatment for resection of adenocarcinoma stage I (T1 a, N0, M0) resected 06/28/2009 from the right upper lobe now 11 years without evidence of recurrence #2 status post stereotactic radiotherapy to the left upper lobe nodule-staged as IA2 (cT1b,N0,M0) completed August 04, 2017  Current CT scan done last week shows no evidence of recurrence Plan repeat CT chest January 2022-which will be 2 years after his last treatment for left upper lobe lung lesion      Grace Isaac MD      [address] [place] [address] Office [phone]   [place]

## 2020-07-13 ENCOUNTER — Other Ambulatory Visit: Payer: Self-pay | Admitting: *Deleted

## 2020-07-13 DIAGNOSIS — R911 Solitary pulmonary nodule: Secondary | ICD-10-CM

## 2020-07-13 NOTE — Progress Notes (Unsigned)
Ct

## 2020-08-24 ENCOUNTER — Other Ambulatory Visit: Payer: Medicare HMO

## 2020-08-24 ENCOUNTER — Ambulatory Visit: Payer: Medicare HMO | Admitting: Cardiothoracic Surgery

## 2020-10-05 ENCOUNTER — Ambulatory Visit
Admission: RE | Admit: 2020-10-05 | Discharge: 2020-10-05 | Disposition: A | Discharge status: Home / Self Care | Payer: Medicare HMO | Source: Ambulatory Visit | Attending: Cardiothoracic Surgery | Admitting: Cardiothoracic Surgery

## 2020-10-05 ENCOUNTER — Ambulatory Visit: Payer: Medicare HMO | Admitting: Cardiothoracic Surgery

## 2020-10-05 ENCOUNTER — Other Ambulatory Visit: Payer: Self-pay

## 2020-10-05 VITALS — BP 130/80 | HR 64 | Resp 20 | Ht 71.0 in | Wt 239.0 lb

## 2020-10-05 DIAGNOSIS — R911 Solitary pulmonary nodule: Secondary | ICD-10-CM

## 2020-10-05 DIAGNOSIS — Z85118 Personal history of other malignant neoplasm of bronchus and lung: Secondary | ICD-10-CM

## 2020-10-05 DIAGNOSIS — Z09 Encounter for follow-up examination after completed treatment for conditions other than malignant neoplasm: Secondary | ICD-10-CM

## 2020-10-05 NOTE — Progress Notes (Signed)
[address]       [place] [address]             [phone]                    [name] Medical Record #[id number] Date of Birth: 12/25/1934  Referring: Kyung Rudd, MD Primary Care: [name], MD   Chief Complaint:   POST OP FOLLOW UP: Lung Resection previous right lower lobectomy for adenocarcinoma 06/28/2009 and stereotactic radiotherapy for new lesion in the left upper lobe in January 2019 by Dr. Lisbeth Renshaw  History of Present Illness:      Brent Mcguire  had a CT scan today  for  follow-up on status post radiation treatment to the left upper lobe stereotactic radiotherapy and resection of right lower lobe.  .  He had resection of a right lower lobe adenocarcinoma the lung [id number] (stage 1)resected 06/28/2009.    Patient had stereotactic radiotherapy to the left upper lobe, a new highly suspicious lesion that was never biopsied.- radiation completed 08/04/2017   Patient has known coronary occlusive disease, he denies any angina or shortness of breath.  In fact notes that he has been making an effort to go to the gym on a daily basis has lost weight down to 236 pounds-he notes that he feels better than he has for several years.  Previously had been followed for his coronary artery disease by Dr. Kathlen Mody the patient notes that he no longer sees cardiology.   Past Medical History:  Diagnosis Date  . Adenocarcinoma ([place]) 07/08/2011   Right Lower Lobe of Lung  . Anemia   . Cholelithiases   . Coronary artery disease   . Diabetes mellitus   . Fatigue   . Foot pain   . Gallstones   . Gastric ulcer   . GI bleed   . Hypercholesteremia   . Hypertension   . Kidney disease   . Lung mass   . Obesity   . Thyroid disease   . UTI (lower urinary tract infection)    Past Surgical History:  Procedure Laterality Date  . CATARACT EXTRACTION    . COLONOSCOPY W/ POLYPECTOMY  2007   3 polyps  . ELBOW SURGERY     pilonidal cyst  .  ELECTROMAGNETIC NAVIGATION BROCHOSCOPY  06/28/2009   Dr. Servando Snare  . PILONIDAL CYST EXCISION    . video assisted thoracoscopy, mini thoracotomy,right lower lobectomy with lymph node dissection  06/28/2009   Dr. Servando Snare    Social History   Tobacco Use  Smoking Status Former Smoker  . Types: Cigarettes  . Quit date: 07/29/1993  . Years since quitting: 27.2  Smokeless Tobacco Never Used    Social History   Substance and Sexual Activity  Alcohol Use No     Allergies  Allergen Reactions  . Gemfibrozil Other (See Comments)  . Other Anxiety    Other reaction(s): Anxiety, Tachycardia    Current Outpatient Medications  Medication Sig Dispense Refill  . aspirin 81 MG tablet Take 81 mg by mouth daily.    . carvedilol (COREG) 6.25 MG tablet Take 1 tablet (6.25 mg total) by mouth 2 (two) times daily with a meal. 180 tablet 3  . clopidogrel (PLAVIX) 75 MG tablet Take 1 tablet (75 mg total) by mouth daily. 90 tablet 3  . furosemide (LASIX) 40 MG tablet Take 40 mg by mouth 2 (two) times daily.    Marland Kitchen  levothyroxine (SYNTHROID, LEVOTHROID) 175 MCG tablet Take 175 mcg by mouth daily before breakfast.    . Multiple Vitamin (MULTIVITAMIN) tablet Take 1 tablet by mouth daily.    . simvastatin (ZOCOR) 40 MG tablet Take 40 mg by mouth at bedtime.     No current facility-administered medications for this visit.       Physical Exam: BP 130/80   Pulse 64   Resp 20   Ht 5\' 11"  (1.803 m)   Wt 239 lb (108.4 kg)   SpO2 99% Comment: RA  BMI 33.33 kg/m  No exam General appearance: alert, cooperative and no distress Neck: no adenopathy, no carotid bruit, no JVD, supple, symmetrical, trachea midline and thyroid not enlarged, symmetric, no tenderness/mass/nodules Lymph nodes: Cervical, supraclavicular, and axillary nodes normal. Resp: clear to auscultation bilaterally Cardio: regular rate and rhythm, S1, S2 normal, no murmur, click, rub or gallop Extremities: extremities normal, atraumatic, no  cyanosis or edema Neurologic: Grossly normal   Diagnostic Studies & Laboratory data:     Recent Radiology Findings  CT CHEST WO CONTRAST  Result Date: 10/05/2020 CLINICAL DATA:  History of lung cancer status post right lower lobe lobectomy and left upper lobe SBRT. EXAM: CT CHEST WITHOUT CONTRAST TECHNIQUE: Multidetector CT imaging of the chest was performed following the standard protocol without IV contrast. COMPARISON:  Multiple prior chest CTs. The most recent is 01/25/2020 FINDINGS: Cardiovascular: The heart is normal in size. No pericardial effusion. Stable aortic and three-vessel coronary artery calcifications. Mediastinum/Nodes: Stable small scattered mediastinal and hilar lymph nodes. No mass or overt adenopathy. The esophagus is grossly normal. Stable small to moderate-sized hiatal hernia. Lungs/Pleura: Stable surgical changes related to a right lower lobe lobectomy with associated scarring changes and volume loss. No findings suspicious for recurrent tumor. Stable appearing soft tissue density involving the anterior left upper lobe, likely a combination of treated tumor and radiation fibrosis. No findings suspicious for recurrent tumor. Stable benign left pleural lipoma. No new pulmonary nodules to suggest pulmonary metastatic disease. No acute pulmonary findings. No pleural effusions or pleural nodules. Upper Abdomen: No significant upper abdominal findings. No worrisome hepatic or adrenal gland lesions are identified without contrast. Stable left renal cyst. Stable vascular calcifications. No upper abdominal adenopathy. Musculoskeletal: No chest wall mass, supraclavicular or axillary adenopathy. The bony thorax is intact. IMPRESSION: 1. Stable surgical changes related to a right lower lobe lobectomy with associated scarring changes and volume loss. No findings suspicious for recurrent tumor. 2. Stable appearing soft tissue density involving the anterior left upper lobe, likely a combination of  treated tumor and radiation fibrosis. No findings suspicious for recurrent tumor. 3. No new pulmonary nodules to suggest pulmonary metastatic disease. 4. Stable small scattered mediastinal and hilar lymph nodes. 5. Stable small to moderate-sized hiatal hernia. 6. Emphysema and aortic atherosclerosis. Aortic Atherosclerosis (ICD10-I70.0) and Emphysema (ICD10-J43.9). Electronically Signed   By: Marijo Sanes M.D.   On: 10/05/2020 13:00   I have independently reviewed the above radiology studies  and reviewed the findings with the patient.   CT CHEST WO CONTRAST  Result Date: 01/25/2020 CLINICAL DATA:  Follow-up lung cancer, status post right lower lobectomy two thousand twelve, status post left upper lobe SP RT two thousand nineteen. EXAM: CT CHEST WITHOUT CONTRAST TECHNIQUE: Multidetector CT imaging of the chest was performed following the standard protocol without IV contrast. COMPARISON:  07/15/2019, 11/12/2018 FINDINGS: Cardiovascular: Aortic atherosclerosis. Normal heart size. No pericardial effusion. Mediastinum/Nodes: No enlarged mediastinal, hilar, or axillary lymph nodes. Small hiatal  hernia. Thyroid gland, trachea, and esophagus demonstrate no significant findings. Lungs/Pleura: Redemonstrated postoperative findings of right lower lobectomy with associated scarring and volume loss. Stable irregular post treatment opacity of the anterior left pulmonary apex (series 3, image 33). No pleural effusion or pneumothorax. Subpleural fat deposition or lipoma over the lateral lingula (series 2, image 98). Upper Abdomen: No acute abnormality. Musculoskeletal: No chest wall mass or suspicious bone lesions identified. IMPRESSION: 1. Redemonstrated postoperative findings of right lower lobectomy with associated scarring and volume loss. 2. Stable post radiation appearance of the anterior left pulmonary apex. 3. No evidence of malignant recurrence or metastatic disease in the chest. 4. Aortic Atherosclerosis  (ICD10-I70.0). Electronically Signed   By: Eddie Candle M.D.   On: 01/25/2020 09:00   Ct Chest Wo Contrast  Result Date: 11/12/2018 CLINICAL DATA:  86 year old male with history of non-small cell lung cancer. Follow-up study. EXAM: CT CHEST WITHOUT CONTRAST TECHNIQUE: Multidetector CT imaging of the chest was performed following the standard protocol without IV contrast. COMPARISON:  Chest CT 04/22/2018. FINDINGS: Cardiovascular: Heart size is normal. There is no significant pericardial fluid, thickening or pericardial calcification. There is aortic atherosclerosis, as well as atherosclerosis of the great vessels of the mediastinum and the coronary arteries, including calcified atherosclerotic plaque in the left main, left anterior descending, left circumflex and right coronary arteries. Mediastinum/Nodes: No pathologically enlarged mediastinal or hilar lymph nodes. Please note that accurate exclusion of hilar adenopathy is limited on noncontrast CT scans. Small hiatal hernia. No axillary lymphadenopathy. Lungs/Pleura: Again noted is an area of mass-like architectural distortion in the apex of the left upper lobe which is very similar to the most recent prior study from 04/22/2018, most compatible with an area of postradiation mass-like fibrosis. More subtle peripheral pleuroparenchymal thickening and architectural distortion in the right lung involving portions of both the right middle and lower lobes laterally, similar to prior examinations, most compatible with chronic scarring. A few patchy areas of subtle ground-glass attenuation are noted throughout the lungs, most evident in the left lower lobe, minimally increased compared to the prior examination, but nonspecific. No definite suspicious appearing pulmonary nodules or masses are noted. No acute consolidative airspace disease. No pleural effusions. Smoothly marginated 1.9 x 4.5 cm low-attenuation (-111 HU) lesion in the periphery of the left hemithorax,  similar to the prior examination, most compatible with a pleural lipoma. Upper Abdomen: 9 mm high attenuation lesion in the upper pole of the right kidney, incompletely characterized, but favored to represent a proteinaceous/hemorrhagic cyst. 3.0 cm low-attenuation lesion in the upper pole of the left kidney, incompletely characterized on today's noncontrast CT examination, but statistically likely to represent a cyst. Status post cholecystectomy. Aortic atherosclerosis. Musculoskeletal: There are no aggressive appearing lytic or blastic lesions noted in the visualized portions of the skeleton. IMPRESSION: 1. Essentially stable appearance of the thorax on today's examination with chronic postradiation mass-like fibrosis in the apex of the left upper lobe. 2. The exception on today's examination is a small amount of increasing ground-glass attenuation in the base of the left lower lobe which is nonspecific, but attention on follow-up studies is recommended. 3. Aortic atherosclerosis, in addition to left main and 3 vessel coronary artery disease. 4. Small hiatal hernia. 5. Additional incidental findings, as above. Aortic Atherosclerosis (ICD10-I70.0). Electronically Signed   By: Vinnie Langton M.D.   On: 11/12/2018 09:25    Ct Chest W Contrast  Result Date: 04/23/2018 CLINICAL DATA:  86 year old male with history of adenocarcinoma of the right  lung status post radiotherapy. EXAM: CT CHEST WITH CONTRAST TECHNIQUE: Multidetector CT imaging of the chest was performed during intravenous contrast administration. CONTRAST:  77mL OMNIPAQUE IOHEXOL 300 MG/ML  SOLN COMPARISON:  Chest CT 10/07/2017. FINDINGS: Cardiovascular: Heart size is normal. There is no significant pericardial fluid, thickening or pericardial calcification. There is aortic atherosclerosis, as well as atherosclerosis of the great vessels of the mediastinum and the coronary arteries, including calcified atherosclerotic plaque in the left main, left  anterior descending, left circumflex and right coronary arteries. Mediastinum/Nodes: Borderline enlarged superior mediastinal lymph node measuring 10 mm in short axis adjacent to the posterolateral aspect of the proximal trachea on the left side (axial image 15 of series 2), stable compared to prior examinations, nonspecific but presumably benign. No other pathologically enlarged mediastinal or hilar lymph nodes. Small hiatal hernia. No axillary lymphadenopathy. Lungs/Pleura: Status post right lower lobectomy, with compensatory hyperexpansion of the right upper and middle lobes. There is a mass-like area of architectural distortion in the left upper lobe which appears more confluent than the prior examination, most compatible with evolving postradiation mass-like fibrosis. No definite suspicious appearing pulmonary nodules or masses are noted on today's examination. Mild scarring in the periphery of the right lung. 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 No acute consolidative airspace disease. No pleural effusions. Upper Abdomen: Status post cholecystectomy. Aortic atherosclerosis. Low-attenuation lesions in the kidneys bilaterally, largest of which are compatible with simple cysts measuring up to 2.8 cm in the upper pole the left kidney. The other smaller low-attenuation lesions are too small to definitively characterize, but statistically likely to represent tiny cysts. Musculoskeletal: There are no aggressive appearing lytic or blastic lesions noted in the visualized portions of the skeleton. IMPRESSION: 1. Evolving postradiation changes near the apex of the left upper lobe, most compatible with developing postradiation mass-like fibrosis. No suspicious findings to suggest metastatic disease on today's examination. 2.  Aortic atherosclerosis, in addition to left main and 3 vessel coronary artery disease. 3. Hepatic steatosis. 4. Additional incidental findings, as above. Aortic Atherosclerosis (ICD10-I70.0). Electronically Signed   By: Vinnie Langton M.D.   On: 04/23/2018 09:45    Nm Pet Image Restag (ps) Skull Base To Thigh  Result Date: 07/01/2017 CLINICAL DATA:  Initial treatment strategy for left upper lobe nodule. Previous right lower lobectomy for lung carcinoma. EXAM: NUCLEAR MEDICINE PET SKULL BASE TO THIGH TECHNIQUE: 13.4 mCi F-18 FDG was injected intravenously. Full-ring PET imaging was performed from the skull base to thigh after the radiotracer. CT data was obtained and used for attenuation correction and anatomic localization. FASTING BLOOD GLUCOSE:  Value: 88 mg/dl COMPARISON:  CT on 06/05/2017 and PET-CT on 12/30/2008 FINDINGS: NECK:  No hypermetabolic lymph nodes or masses. CHEST: No hypermetabolic lymphadenopathy. 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 Postsurgical changes from right lower lobectomy, with scarring showing mild FDG uptake along the right lateral chest wall. A 1.7 cm irregular pulmonary nodule is seen in the medial left lung apex which was not seen on 2010 exam, and shows mild FDG uptake with SUV max of 3.1. ABDOMEN/PELVIS: No abnormal hypermetabolic activity within the liver, pancreas, adrenal glands, or spleen. No hypermetabolic lymph nodes in the abdomen or pelvis. Small hiatal hernia is seen. Mild to moderate prostate enlargement. Aortic atherosclerosis. Left-sided colonic diverticulosis, without evidence of diverticulitis.  SKELETON: No focal hypermetabolic bone lesions to suggest skeletal metastasis. IMPRESSION: 1.7 cm irregular pulmonary nodule in medial left lung apex is new since 2010, and shows low-grade metabolic activity with SUV max of 3.1. Differential diagnosis includes low-grade carcinoma and inflammatory/infectious etiologies. No evidence of thoracic nodal or  distant metastatic disease. Electronically Signed   By: Earle Gell M.D.   On: 07/01/2017 17:03    Ct Chest Nodule Follow Up Low Dose W/o  Result Date: 06/13/2016 CLINICAL DATA:  86 year old male with history of right lower lobe lung cancer status post lobectomy in 2010. Followup study. EXAM: CT CHEST WITHOUT CONTRAST TECHNIQUE: Multidetector CT imaging of the chest was performed following the standard protocol without IV contrast. COMPARISON:  Chest CT 06/20/2015. FINDINGS: Cardiovascular: Heart size is normal. There is no significant pericardial fluid, thickening or pericardial calcification. There is aortic atherosclerosis, as well as atherosclerosis of the great vessels of the mediastinum and the coronary arteries, including calcified atherosclerotic plaque in the left main, left anterior descending, left circumflex and right coronary arteries. Mediastinum/Nodes: No pathologically enlarged mediastinal or hilar lymph nodes. Please note that accurate exclusion of hilar adenopathy is limited on noncontrast CT scans. Small hiatal hernia. No axillary lymphadenopathy. Lungs/Pleura: Status post right lower lobectomy with compensatory hyperexpansion of the right middle and upper lobes. Small left upper lobe nodule near the apex measuring 6 mm (image 21 of series 4) is unchanged. There is an adjacent 9 x 3 mm nodule (mean diameter of 6 mm) in the left upper lobe (image 24 of series 4) which appears slightly larger than the prior examination. No other suspicious appearing pulmonary nodules or masses are noted. Fatty attenuation lesion in the lower left hemithorax laterally is similar to prior studies, most compatible with a pleural lipoma. No acute consolidative airspace disease. No pleural effusions. Tiny calcified granuloma in the periphery of the right upper lobe incidentally noted. Upper Abdomen: Aortic atherosclerosis.  Status post cholecystectomy. Musculoskeletal: There are no aggressive appearing lytic or  blastic lesions noted in the visualized portions of the skeleton. IMPRESSION: 1. Status post right lower lobectomy. No findings to suggest local recurrence of disease. 2. Two nonspecific pulmonary nodules near the apex of the left upper lobe, as above. One of these measuring 6 mm has been stable compared a prior examinations, while the other lesion which currently measures 9 x 3 mm (mean diameter of 6 mm) has grown slightly compared to prior studies. Continued attention on future follow-up imaging is recommended. 3. Aortic atherosclerosis, in addition to left main and 3 vessel coronary artery disease. Assessment for potential risk factor modification, dietary therapy or pharmacologic therapy may be warranted, if clinically indicated. 4. Additional incidental findings, as above. Electronically Signed   By: Vinnie Langton M.D.   On: 06/13/2016 12:46   Ct Chest Wo Contrast  06/20/2015  CLINICAL DATA:  History of right lower lobectomy for lung cancer. Ex-smoker. EXAM: CT CHEST WITHOUT CONTRAST TECHNIQUE: Multidetector CT imaging of the chest was performed following the standard protocol without IV contrast. COMPARISON:  06/09/2014 FINDINGS: Mediastinum/Nodes: No supraclavicular adenopathy. Aortic and branch vessel atherosclerosis. Mild cardiomegaly with lipomatous hypertrophy of the interatrial septum. Multivessel coronary artery atherosclerosis. No mediastinal or definite hilar adenopathy, given limitations of unenhanced CT. Lungs/Pleura: No pleural fluid. Left-sided anterior pleural lipoma again identified. Foci of right-sided pleural thickening again identified. Right lower lobectomy. Right upper lobe calcified granuloma. Volume loss in the right lung base is similar. Left apical ground-glass and soft tissue nodularity is again  identified. Maximally 6 mm (image 9, series 4). Coronal image 70. Subsegmental atelectasis in the dependent left lower lobe. Upper abdomen: Cholecystectomy. Normal imaged portions of the  liver, spleen, stomach, pancreas, adrenal glands, kidneys. Musculoskeletal: No acute osseous abnormality. Accentuation of expected thoracic kyphosis IMPRESSION: 1. Status post right lower lobectomy, without recurrent or metastatic disease. 2. Similar left apical ground-glass and soft tissue nodularity. This warrants followup attention but is most likely benign. 3.  Atherosclerosis, including within the coronary arteries. Electronically Signed   By: Abigail Miyamoto M.D.   On: 06/20/2015 14:34   Ct Chest Wo Contrast  06/09/2014   CLINICAL DATA:  Lung cancer.  EXAM: CT CHEST WITHOUT CONTRAST  TECHNIQUE: Multidetector CT imaging of the chest was performed following the standard protocol without IV contrast.  COMPARISON:  04/29/2013 and 04/02/2012.  FINDINGS: No pathologically enlarged mediastinal or axillary lymph nodes. Hilar regions are difficult to definitively evaluate without IV contrast. Atherosclerotic calcification of the arterial vasculature, including three-vessel involvement of the coronary arteries. Heart size normal. No pericardial effusion. Small hiatal hernia.  Postoperative changes of right lower lobectomy with associated scarring and volume loss in the right hemi thorax, stable. Sub cm ground-glass nodular densities in the apical left upper lobe measure up to 7 mm, stable. Extrapleural fat proliferation or lipoma along the anterolateral margin of the left hemi thorax. No pleural fluid. Airway is otherwise unremarkable.  Incidental imaging of the upper abdomen shows the visualized portions of the liver to be grossly unremarkable. Cholecystectomy. Visualized portions of the adrenal glands and right kidney are unremarkable. Low-attenuation lesion in the upper pole left kidney measures 2.2 cm, likely stable from 04/02/2012, favoring a cyst. Visualized portions of the spleen, pancreas, stomach and bowel are otherwise grossly unremarkable. No upper abdominal adenopathy.  No worrisome lytic or sclerotic  lesions. Degenerative changes are seen in the spine. Thoracotomy changes on the right.  IMPRESSION: 1. Postoperative changes of right lower lobectomy without evidence of recurrent or metastatic disease. 2. Left upper lobe ground-glass nodules are unchanged. Continued attention on followup exams is warranted. 3. Extensive 3 vessel coronary artery calcification.   Electronically Signed   By: Lorin Picket M.D.   On: 06/09/2014 12:27      Ct Chest Low Dose Pilot W/o Cm  04/29/2013   *RADIOLOGY REPORT*  Clinical Data: Follow-up lung cancer.  CT CHEST LOW DOSE PILOT WITHOUT CONTRAST  Technique: Multidetector CT imaging of the chest using the standard low-dose protocol without administration of intravenous contrast.  Comparison: Chest CT 04/02/2012  Findings: Low dose technique limits evaluation.  Visualized neck base is unremarkable.  No enlarged axillary, mediastinal or hilar lymphadenopathy.  Normal heart size.  Coronary artery calcifications.  No pericardial effusion.  Large hiatal hernia.  Central airways are patent.  Minimal ground-glass opacity within the dependent left lower lobe.  Unchanged 7 mm ground-glass opacity within the left upper lobe (image 34; series 3).  Postsurgical change compatible with right lower lobe resection. Unchanged peripheral scarring / postsurgical change within the residual right lung.  No pleural effusion pneumothorax.  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  Incidental imaging of the upper abdomen demonstrates post cholecystectomy changes.  Normal bilateral adrenal glands.  No aggressive or acute appearing osseous lesions.  IMPRESSION: 1.  Postsurgical change related to prior right lower lobe resection.  No evidence for recurrent or metastatic disease. 2.  Unchanged small ground-glass nodule within the left upper lobe. Attention on follow-up. 3.  Interval development of patchy  ground-glass opacities within the left lower lobe which likely represent atelectasis.   Aspiration or infection are considered less likely.   Original Report Authenticated By: Lovey Newcomer, M.D      Recent Lab Findings: Lab Results  Component Value Date   WBC 7.4 06/13/2009   HGB 14.6 06/13/2009   HCT 44.3 06/13/2009   PLT 163.0 06/13/2009   GLUCOSE 106 (H) 06/14/2009   NA 142 06/13/2009   K 4.6 06/13/2009   CL 106 06/13/2009   CREATININE 1.50 (H) 04/22/2018   BUN 17 10/07/2017   CO2 29 06/13/2009   INR 1.1 ratio (H) 06/13/2009      Assessment / Plan:   #1 follow-up CT scan after previous treatment for resection of adenocarcinoma stage I (T1 a, N0, M0) resected 06/28/2009 from the right upper lobe now 12 years without evidence of recurrence  #2 status post stereotactic radiotherapy to the left upper lobe nodule-staged as IA2 (cT1b,N0,M0) completed August 04, 2017    Current CT scan done last week shows no evidence of recurrence  Plan follow-up CT of the chest for follow-up of the left upper lobe lesion in January 2023   Grace Isaac MD      [address] [place] [address] Office [phone]   [place]

## 2021-07-02 ENCOUNTER — Other Ambulatory Visit: Payer: Self-pay | Admitting: *Deleted

## 2021-07-02 DIAGNOSIS — R911 Solitary pulmonary nodule: Secondary | ICD-10-CM

## 2021-08-07 ENCOUNTER — Other Ambulatory Visit: Payer: Self-pay

## 2021-08-07 ENCOUNTER — Ambulatory Visit: Payer: Medicare HMO | Admitting: Surgical

## 2021-08-07 ENCOUNTER — Ambulatory Visit
Admission: RE | Admit: 2021-08-07 | Discharge: 2021-08-07 | Disposition: A | Discharge status: Home / Self Care | Payer: Medicare HMO | Source: Ambulatory Visit | Attending: Thoracic Surgery (Cardiothoracic Vascular Surgery) | Admitting: Thoracic Surgery (Cardiothoracic Vascular Surgery)

## 2021-08-07 VITALS — BP 143/70 | HR 87 | Resp 20

## 2021-08-07 DIAGNOSIS — R911 Solitary pulmonary nodule: Secondary | ICD-10-CM

## 2021-08-07 DIAGNOSIS — Z09 Encounter for follow-up examination after completed treatment for conditions other than malignant neoplasm: Secondary | ICD-10-CM

## 2021-08-07 DIAGNOSIS — Z85118 Personal history of other malignant neoplasm of bronchus and lung: Secondary | ICD-10-CM

## 2021-08-07 NOTE — Patient Instructions (Signed)
No specific recommendation

## 2021-08-07 NOTE — Progress Notes (Signed)
Subjective:    Patient ID: Brent Mcguire, male    DOB: 02/07/1935, 87 y.o.   MRN: [id number]  Chief Complaint:   POST OP FOLLOW UP: Lung Resection previous right lower lobectomy for adenocarcinoma 06/28/2009 and stereotactic radiotherapy for new lesion in the left upper lobe in January 2019 by Dr. Lisbeth Renshaw  HPI patient is an 87 year old male status post the above described procedures seen in the office on today's date for follow-up CT scan of the chest.  Patient currently states that he feels well.  He denies shortness of breath or chest pain.  He has no complaints of palpitations or lower extremity edema.  He denies cough or sputum production.  He denies hemoptysis.  He has to the gym for exercise every day except weekends.  He has no specific complaints at this time.  Past Medical History:  Diagnosis Date   Adenocarcinoma ([place]) 07/08/2011   Right Lower Lobe of Lung   Anemia    Cholelithiases    Coronary artery disease    Diabetes mellitus    Fatigue    Foot pain    Gallstones    Gastric ulcer    GI bleed    Hypercholesteremia    Hypertension    Kidney disease    Lung mass    Obesity    Thyroid disease    UTI (lower urinary tract infection)     Past Surgical History:  Procedure Laterality Date   CATARACT EXTRACTION     COLONOSCOPY W/ POLYPECTOMY  2007   3 polyps   ELBOW SURGERY     pilonidal cyst   ELECTROMAGNETIC NAVIGATION BROCHOSCOPY  06/28/2009   Dr. Servando Snare   PILONIDAL CYST EXCISION     video assisted thoracoscopy, mini thoracotomy,right lower lobectomy with lymph node dissection  06/28/2009   Dr. Servando Snare    No family history on file.  Social History   Socioeconomic History   Marital status: Single    Spouse name: Not on file   Number of children: Not on file   Years of education: Not on file   Highest education level: Not on file  Occupational History   Occupation: retired    Fish farm manager: RETIRED  Tobacco Use   Smoking status: Former    Types:  Cigarettes    Quit date: 07/29/1993    Years since quitting: 28.0   Smokeless tobacco: Never  Substance and Sexual Activity   Alcohol use: No   Drug use: No   Sexual activity: Not on file  Other Topics Concern   Not on file  Social History Narrative   Not on file   Social Determinants of Health   Financial Resource Strain: Not on file  Food Insecurity: Not on file  Transportation Needs: Not on file  Physical Activity: Not on file  Stress: Not on file  Social Connections: Not on file  Intimate Partner Violence: Not on file    Outpatient Medications Prior to Visit  Medication Sig Dispense Refill   aspirin 81 MG tablet Take 81 mg by mouth daily.     carvedilol (COREG) 6.25 MG tablet Take 1 tablet (6.25 mg total) by mouth 2 (two) times daily with a meal. 180 tablet 3   clopidogrel (PLAVIX) 75 MG tablet Take 1 tablet (75 mg total) by mouth daily. 90 tablet 3   furosemide (LASIX) 40 MG tablet Take 40 mg by mouth 2 (two) times daily.     levothyroxine (SYNTHROID, LEVOTHROID) 175 MCG tablet Take  175 mcg by mouth daily before breakfast.     Multiple Vitamin (MULTIVITAMIN) tablet Take 1 tablet by mouth daily.     simvastatin (ZOCOR) 40 MG tablet Take 40 mg by mouth at bedtime.     No facility-administered medications prior to visit.    Allergies  Allergen Reactions   Gemfibrozil Other (See Comments)   Other Anxiety    Other reaction(s): Anxiety, Tachycardia        Objective:    Physical Exam Constitutional:      Appearance: Normal appearance. He is not ill-appearing.  HENT:     Head: Normocephalic and atraumatic.  Cardiovascular:     Rate and Rhythm: Normal rate and regular rhythm.     Heart sounds: No murmur heard.   No friction rub. No gallop.  Pulmonary:     Effort: Pulmonary effort is normal.     Breath sounds: Normal breath sounds.  Abdominal:     Palpations: Abdomen is soft.     Tenderness: There is no abdominal tenderness.  Musculoskeletal:     Right lower  leg: No edema.     Left lower leg: No edema.  Neurological:     Mental Status: He is alert.  Psychiatric:        Mood and Affect: Mood normal.        Behavior: Behavior normal.    There were no vitals taken for this visit. Wt Readings from Last 3 Encounters:  10/05/20 239 lb (108.4 kg)  02/24/20 (!) 259 lb (117.5 kg)  04/23/18 270 lb (122.5 kg)    Health Maintenance Due  Topic Date Due   HEMOGLOBIN A1C  Never done   FOOT EXAM  Never done   OPHTHALMOLOGY EXAM  Never done   URINE MICROALBUMIN  Never done   Zoster Vaccines- Shingrix (1 of 2) Never done   COVID-19 Vaccine (6 - Booster for Pfizer series) 07/10/2020   INFLUENZA VACCINE  02/26/2021    There are no preventive care reminders to display for this patient.   No results found for: TSH Lab Results  Component Value Date   WBC 7.4 06/13/2009   HGB 14.6 06/13/2009   HCT 44.3 06/13/2009   MCV 87.8 06/13/2009   PLT 163.0 06/13/2009   Lab Results  Component Value Date   NA 142 06/13/2009   K 4.6 06/13/2009   CO2 29 06/13/2009   GLUCOSE 106 (H) 06/14/2009   BUN 17 10/07/2017   CREATININE 1.50 (H) 04/22/2018   CALCIUM 9.4 06/13/2009   No results found for: CHOL No results found for: HDL No results found for: LDLCALC No results found for: TRIG No results found for: CHOLHDL No results found for: HGBA1C    CT CHEST WO CONTRAST  Result Date: 08/07/2021 CLINICAL DATA:  Lung cancer, right lower lobectomy EXAM: CT CHEST WITHOUT CONTRAST TECHNIQUE: Multidetector CT imaging of the chest was performed following the standard protocol without IV contrast. COMPARISON:  Multiple exams, including 10/05/2020 FINDINGS: Cardiovascular: Substantial coronary, aortic arch, and branch vessel atherosclerotic vascular disease. Mediastinum/Nodes: At the thoracic inlet, a posterior lobulation of the thyroid gland is unchanged back through 2011 and considered benign/incidental. No pathologic thoracic adenopathy identified. Small hiatal  hernia. Lungs/Pleura: Right lower lobectomy. Old granulomatous disease. Stable peripheral scarring in the right upper lobe and right middle lobe for example on image 84 series 8. Stable triangular in bandlike opacity in the left upper lobe anteriorly, probably therapy related. No worrisome new lesion. Incidental pleural lipoma along the lingula.  Mild adjacent lingular scarring. Upper Abdomen: Cholecystectomy. 2.3 by 2.0 cm nonspecific exophytic lesion from the left kidney upper pole, internal density of about 27 Hounsfield units. This lesion was present but smaller on remote prior exams, measuring about 1.3 cm in diameter on 12/08/2008. Musculoskeletal: Thoracic spondylosis with flowing osteophytes. IMPRESSION: 1. No findings of active/recurrent malignancy. 2. Stable appearance of right lower lobectomy and stable band of soft tissue density in the anterior left upper lobe likely from prior local radiation therapy. 3. Nonspecific 2.3 cm exophytic lesion from the left kidney upper pole has density greater than fluid. This may well be a complex cyst given that this lesion is been present for at least 12 years, although smaller back in 2010. I do not perceive definite hypermetabolic activity along this lesion looking back at the PET-CT from 07/01/2017. 4. Substantial atherosclerosis. Coronary atherosclerosis. Aortic Atherosclerosis (ICD10-I70.0). The 5. Small hiatal hernia. Electronically Signed   By: Van Clines M.D.   On: 08/07/2021 13:27    Assessment & Plan:   Problem List Items Addressed This Visit   None  Assessment/plan: The patient scan is very stable in regards to any findings worrisome for malignancy.  He does have a cyst noted in his left kidney which has been stable for approximately 12 years.  He denies any cardiac symptoms at this time.  We will repeat scans and 12 months.  No orders of the defined types were placed in this encounter.    John Giovanni, PA-C

## 2022-07-05 ENCOUNTER — Other Ambulatory Visit: Payer: Self-pay | Admitting: Thoracic Surgery (Cardiothoracic Vascular Surgery)

## 2022-07-05 DIAGNOSIS — C3431 Malignant neoplasm of lower lobe, right bronchus or lung: Secondary | ICD-10-CM

## 2022-07-11 DIAGNOSIS — E278 Other specified disorders of adrenal gland: Secondary | ICD-10-CM | POA: Insufficient documentation

## 2022-07-11 DIAGNOSIS — E279 Disorder of adrenal gland, unspecified: Secondary | ICD-10-CM | POA: Insufficient documentation

## 2022-08-20 ENCOUNTER — Other Ambulatory Visit: Payer: Medicare HMO

## 2022-08-20 ENCOUNTER — Ambulatory Visit: Payer: Medicare HMO

## 2022-09-15 ENCOUNTER — Other Ambulatory Visit: Payer: Self-pay | Admitting: Oncology

## 2022-09-15 DIAGNOSIS — C3431 Malignant neoplasm of lower lobe, right bronchus or lung: Secondary | ICD-10-CM

## 2022-09-15 NOTE — Progress Notes (Unsigned)
[facility]  [address] [place],    [id number] [phone]  Clinic Day:  09/16/2022  Referring physician: [name], MD   HISTORY OF PRESENT ILLNESS:  The patient is a 88 y.o. male who I was asked to consult upon for bilateral, enlarged adrenal glands per a December 2023 CT scan.  They were new and highly concerning for metastatic disease.   The patient claims a bleeding ulcer was found recently; apparently, a CT scan was done to evaluate for this and other abnormal GI tract pathology.  He claims he has had back pain for months.  Of note, he underwent a right lower lobe lobectomy in December 2010, which revealed a 1.7 cm well-differentiated adenocarcinoma.  11 lymph nodes were removed, for which none contained cancer.  As this was a stage IA lung adenocarcinoma, adjuvant chemotherapy was not given.    PAST MEDICAL HISTORY:   Past Medical History:  Diagnosis Date   Adenocarcinoma ([place]) 07/08/2011   Right Lower Lobe of Lung   Anemia    Cholelithiases    Coronary artery disease    Diabetes mellitus    Fatigue    Foot pain    Gallstones    Gastric ulcer    GI bleed    Hypercholesteremia    Hypertension    Kidney disease    Lung cancer ([place])    Lung mass    Obesity    Thyroid disease    UTI (lower urinary tract infection)     PAST SURGICAL HISTORY:   Past Surgical History:  Procedure Laterality Date   BLEEDING ULCER     CARDIAC CATHERIZATION     CATARACT EXTRACTION     CHOLECYSTECTOMY     COLONOSCOPY W/ POLYPECTOMY  07/29/2005   3 polyps   ELBOW SURGERY     pilonidal cyst   ELECTROMAGNETIC NAVIGATION BROCHOSCOPY  06/28/2009   Dr. Servando Snare   ESOPHAGEAL DILATION     PILONIDAL CYST EXCISION     video assisted thoracoscopy, mini thoracotomy,right lower lobectomy with lymph node dissection  06/28/2009   Dr. Servando Snare    CURRENT MEDICATIONS:   Current Outpatient Medications  Medication Sig Dispense Refill    allopurinol (ZYLOPRIM) 100 MG tablet Take 1 tablet by mouth daily.     losartan (COZAAR) 25 MG tablet Take 1 tablet by mouth daily.     pantoprazole (PROTONIX) 40 MG tablet Take by mouth.     bethanechol (URECHOLINE) 50 MG tablet Take 50 mg by mouth 2 (two) times daily.     carvedilol (COREG) 6.25 MG tablet Take 1 tablet (6.25 mg total) by mouth 2 (two) times daily with a meal. 180 tablet 3   finasteride (PROSCAR) 5 MG tablet SMARTSIG:1 Tablet(s) By Mouth Every Evening     furosemide (LASIX) 40 MG tablet Take 40 mg by mouth 2 (two) times daily.     levothyroxine (SYNTHROID, LEVOTHROID) 175 MCG tablet Take 175 mcg by mouth daily before breakfast.     Multiple Vitamin (MULTIVITAMIN) tablet Take 1 tablet by mouth daily.     simvastatin (ZOCOR) 40 MG tablet Take 40 mg by mouth at bedtime.     tamsulosin (FLOMAX) 0.4 MG CAPS capsule Take 0.4 mg by mouth 2 (two) times daily.     No current facility-administered medications for this visit.    ALLERGIES:   Allergies  Allergen Reactions   Gemfibrozil Other (See Comments)   Other Anxiety    Other reaction(s): Anxiety,  Tachycardia    FAMILY HISTORY:   Family History  Problem Relation Age of Onset   Alzheimer's disease Father    Breast cancer Sister    Heart disease Sister    Breast cancer Niece     SOCIAL HISTORY:  The patient was born and raised in [place].  He lives in the [place] by himself.  He is not married and has no kids.  He worked with a Engineer, structural for 10 years.  He smoked 2 packs of cigarettes daily for 20 years before quitting 30 years ago.  He admits to moderate alcohol use in the past.    REVIEW OF SYSTEMS:  Review of Systems  Constitutional:  Positive for fatigue and unexpected weight change. Negative for fever.  Respiratory:  Negative for chest tightness, cough, hemoptysis and shortness of breath.   Cardiovascular:  Negative for chest pain and palpitations.  Gastrointestinal:  Positive for  constipation. Negative for abdominal distention, abdominal pain, blood in stool, diarrhea, nausea and vomiting.  Genitourinary:  Negative for dysuria, frequency and hematuria.   Musculoskeletal:  Positive for back pain. Negative for arthralgias and myalgias.  Skin:  Negative for itching and rash.  Neurological:  Negative for dizziness, headaches and light-headedness.  Psychiatric/Behavioral:  Negative for depression and suicidal ideas. The patient is not nervous/anxious.      PHYSICAL EXAM:  Blood pressure (!) 144/65, pulse 76, temperature (!) 97.5 F (36.4 C), resp. rate 18, height 6' (1.829 m), weight 220 lb (99.8 kg), SpO2 97 %. Wt Readings from Last 3 Encounters:  09/16/22 220 lb (99.8 kg)  10/05/20 239 lb (108.4 kg)  02/24/20 (!) 259 lb (117.5 kg)   Body mass index is 29.84 kg/m. Performance status (ECOG): 1 - Symptomatic but completely ambulatory Physical Exam Constitutional:      Appearance: Normal appearance. He is not ill-appearing.  HENT:     Mouth/Throat:     Mouth: Mucous membranes are moist.     Pharynx: Oropharynx is clear. No oropharyngeal exudate or posterior oropharyngeal erythema.  Cardiovascular:     Rate and Rhythm: Normal rate and regular rhythm.     Heart sounds: No murmur heard.    No friction rub. No gallop.  Pulmonary:     Effort: Pulmonary effort is normal. No respiratory distress.     Breath sounds: Normal breath sounds. No wheezing, rhonchi or rales.  Abdominal:     General: Bowel sounds are normal. There is no distension.     Palpations: Abdomen is soft. There is no mass.     Tenderness: There is no abdominal tenderness.  Musculoskeletal:        General: No swelling.     Right lower leg: No edema.     Left lower leg: No edema.  Lymphadenopathy:     Cervical: No cervical adenopathy.     Upper Body:     Right upper body: No supraclavicular or axillary adenopathy.     Left upper body: No supraclavicular or axillary adenopathy.     Lower Body: No  right inguinal adenopathy. No left inguinal adenopathy.  Skin:    General: Skin is warm.     Coloration: Skin is not jaundiced.     Findings: No lesion or rash.  Neurological:     General: No focal deficit present.     Mental Status: He is alert and oriented to person, place, and time. Mental status is at baseline.  Psychiatric:  Mood and Affect: Mood normal.        Behavior: Behavior normal.        Thought Content: Thought content normal.    LABS:      Latest Ref Rng & Units 09/16/2022   12:00 AM 06/13/2009   11:01 AM  CBC  WBC  6.6     7.4   Hemoglobin 13.5 - 17.5 10.4     14.6   Hematocrit 41 - 53 33     44.3   Platelets 150 - 400 K/uL 265     163.0      This result is from an external source.      Latest Ref Rng & Units 09/16/2022   12:00 AM 04/22/2018    3:41 PM 10/07/2017    3:08 PM  CMP  BUN 4 - 21 27      17    Creatinine 0.6 - 1.3 1.6     1.50  1.94   Sodium 137 - 147 138        Potassium 3.5 - 5.1 mEq/L 4.7        Chloride 99 - 108 104        CO2 13 - 22 28        Calcium 8.7 - 10.7 9.2        Alkaline Phos 25 - 125 75        AST 14 - 40 23        ALT 10 - 40 U/L 9           This result is from an external source.   STUDIES: CT scans of his chest/abdomen/pelvis in December 2023 revealed the following:  FINDINGS: CTA CHEST FINDINGS  Cardiovascular: Heart is normal size. Moderate calcified plaque over the left main and 3 vessel coronary arteries. Mild calcification at the aortic root with calcified plaque over the thoracic aorta. Thoracic aorta is normal in caliber. There is no evidence of aneurysm or dissection. Pulmonary arterial system is unremarkable.  Mediastinum/Nodes: Increase in size of a 1.5 cm AP window lymph node as well as 1.5 cm left hilar lymph node. Few other shotty mediastinal lymph nodes without significant change. Suggestion of small sliding hiatal hernia.  Lungs/Pleura: Scarring/post treatment change over the medial left upper  lobe which is stable. Stable lipoma over the pleural surface of the anterolateral left mid thorax. Left lung is otherwise clear. Postsurgical changes over the right lung and hilar region which are stable. Right lung is otherwise clear. Airways are normal.  Musculoskeletal: No focal abnormality  Review of the MIP images confirms the above findings.  CTA ABDOMEN AND PELVIS FINDINGS  VASCULAR  Aorta: Abdominal aorta is normal in caliber with moderate calcified plaque throughout its course. No aneurysm or dissection.  Celiac: Patent without evidence of aneurysm, dissection, vasculitis or significant stenosis.  SMA: Patent without evidence of aneurysm, dissection, vasculitis or significant stenosis.  Renals: Left renal artery is normal. Smaller but patent right renal artery with accessory right renal artery.  IMA: Patent without evidence of aneurysm, dissection, vasculitis or significant stenosis.  Inflow: Moderate calcified plaque over the common iliac arteries which are patent. External iliac arteries are patent to the femoral bifurcation. Visualized femoral and profundus femoral arteries are patent bilaterally.  Veins: No obvious venous abnormality within the limitations of this arterial phase study.  Review of the MIP images confirms the above findings.  NON-VASCULAR  Hepatobiliary: Previous cholecystectomy. Liver and biliary tree are normal.  Pancreas:  Normal.  Spleen: Normal.  Adrenals/Urinary Tract: New 2 x 2.6 cm right adrenal mass with Hounsfield unit measurements in the 40s on the noncontrast images. New similar appearing 1.7 x 2.6 cm left adrenal mass. Findings concerning for metastatic disease in this patient with known history of lung cancer. Kidneys are normal in size without hydronephrosis or nephrolithiasis. Multiple small stable renal cysts are present. There are a few small bilateral hyperdense renal cortical nodules likely hyperdense cysts with the  largest measuring 1.5 cm over the upper pole left kidney unchanged. Ureters are normal. Bladder is mildly distended with small diverticulum over the posterior right lateral aspect.  Stomach/Bowel: Small sliding hiatal hernia. Stomach is otherwise unremarkable. Small bowel is within normal. Appendix is normal. Minimal diverticulosis of the colon which is otherwise unremarkable.  Lymphatic: No adenopathy.  Reproductive: Normal.  Other: No free fluid or focal inflammatory change.  Musculoskeletal: No focal abnormality.  Review of the MIP images confirms the above findings.  IMPRESSION: 1. No evidence of thoracoabdominal aortic aneurysm or dissection. 2. New bilateral adrenal masses as described. Findings are concerning for metastatic disease in this patient with known history of lung cancer. 3. New adenopathy as described involving the AP window and left hilum concerning for metastatic disease. Stable postsurgical changes over the right lung and hilar region. Stable scarring/post treatment change over the medial left upper lobe. 4. Aortic atherosclerosis. Atherosclerotic coronary artery disease. 5. Small sliding hiatal hernia. 6. Multiple bilateral renal cysts as well as a few small bilateral hyperdense renal cortical nodules likely hyperdense cysts unchanged. 7. Minimal colonic diverticulosis. 8. Small bladder diverticulum.  Aortic Atherosclerosis (ICD10-I70.0).  ASSESSMENT & PLAN:  An 88 y.o. male who I was asked to consult upon due to recent CT scans showing bilateral, enlarged adrenal glands.  In clinic today, I went over all of his CT scan images with him, for which he could see that his adrenal glands had significantly enlarged since January 2023.  These findings are highly concerning for metastatic disease being present.  I did speak with interventional radiology, who felt there was an angle by which they could biopsy his right adrenal gland and get cores that would help  establish the etiology behind his likely metastatic adrenal lesions.  This biopsy will be arranged in the forthcoming weeks.  I will tentatively see this patient back in 3 weeks to go over his biopsy results and their implications.  The patient understands all the plans discussed today and is in agreement with them.  I do appreciate [name], MD for his new consult.   Nolie Bignell Macarthur Critchley, MD

## 2022-09-16 ENCOUNTER — Inpatient Hospital Stay: Payer: Medicare HMO | Attending: Oncology | Admitting: Oncology

## 2022-09-16 ENCOUNTER — Encounter: Payer: Self-pay | Admitting: Oncology

## 2022-09-16 ENCOUNTER — Inpatient Hospital Stay: Payer: Medicare HMO

## 2022-09-16 VITALS — BP 144/65 | HR 76 | Temp 97.5°F | Resp 18 | Ht 72.0 in | Wt 220.0 lb

## 2022-09-16 DIAGNOSIS — E278 Other specified disorders of adrenal gland: Secondary | ICD-10-CM

## 2022-09-16 DIAGNOSIS — C3431 Malignant neoplasm of lower lobe, right bronchus or lung: Secondary | ICD-10-CM

## 2022-09-16 LAB — CBC AND DIFFERENTIAL
HCT: 33 — AB (ref 41–53)
Hemoglobin: 10.4 — AB (ref 13.5–17.5)
Neutrophils Absolute: 4.29
Platelets: 265 10*3/uL (ref 150–400)
WBC: 6.6

## 2022-09-16 LAB — COMPREHENSIVE METABOLIC PANEL
Albumin: 3.7 (ref 3.5–5.0)
Calcium: 9.2 (ref 8.7–10.7)

## 2022-09-16 LAB — BASIC METABOLIC PANEL
BUN: 27 — AB (ref 4–21)
CO2: 28 — AB (ref 13–22)
Chloride: 104 (ref 99–108)
Creatinine: 1.6 — AB (ref 0.6–1.3)
Glucose: 99
Potassium: 4.7 mEq/L (ref 3.5–5.1)
Sodium: 138 (ref 137–147)

## 2022-09-16 LAB — CBC: RBC: 3.91 (ref 3.87–5.11)

## 2022-09-16 LAB — HEPATIC FUNCTION PANEL
ALT: 9 U/L — AB (ref 10–40)
AST: 23 (ref 14–40)
Alkaline Phosphatase: 75 (ref 25–125)
Bilirubin, Total: 0.3

## 2022-09-17 ENCOUNTER — Telehealth: Payer: Self-pay | Admitting: Oncology

## 2022-09-17 NOTE — Telephone Encounter (Signed)
09/17/22 Next appt scheduled and confirmed with patient

## 2022-09-24 ENCOUNTER — Other Ambulatory Visit: Payer: Self-pay | Admitting: Oncology

## 2022-09-24 DIAGNOSIS — E278 Other specified disorders of adrenal gland: Secondary | ICD-10-CM

## 2022-09-25 LAB — PROTIME-INR: Protime: 11.2 (ref 10.0–13.8)

## 2022-09-25 LAB — POCT INR: INR: 1.1 (ref 0.80–1.20)

## 2022-09-27 ENCOUNTER — Encounter: Payer: Self-pay | Admitting: Oncology

## 2022-10-06 NOTE — Progress Notes (Unsigned)
[facility]  [address] [place],  [place]  [address] [phone]  Clinic Day:  10/07/2022  Referring physician: [name], MD  HISTORY OF PRESENT ILLNESS:  The patient is a 88 y.o. male who I recently began seeing for having bilateral, enlarged adrenal glands seen per a December 2023 CT scan.  He comes in today to go over his right adrenal gland biopsy results and their implications.  Since his last visit, the patient has been doing fairly well.  He still has occasional back pain.   Of note, he underwent a right lower lobe lobectomy in December 2010, which revealed a 1.7 cm well-differentiated adenocarcinoma.  11 lymph nodes were removed, for which none contained cancer.  As this was a stage IA lung adenocarcinoma, adjuvant chemotherapy was not given.    PHYSICAL EXAM:  Blood pressure 121/64, pulse (!) 102, temperature 97.6 F (36.4 C), resp. rate 20, height 6' (1.829 m), weight 206 lb 6.4 oz (93.6 kg), SpO2 98 %. Wt Readings from Last 3 Encounters:  10/07/22 206 lb 6.4 oz (93.6 kg)  09/16/22 220 lb (99.8 kg)  10/05/20 239 lb (108.4 kg)   Body mass index is 27.99 kg/m. Performance status (ECOG): 1 - Symptomatic but completely ambulatory Physical Exam Constitutional:      Appearance: Normal appearance. He is not ill-appearing.  HENT:     Mouth/Throat:     Mouth: Mucous membranes are moist.     Pharynx: Oropharynx is clear. No oropharyngeal exudate or posterior oropharyngeal erythema.  Cardiovascular:     Rate and Rhythm: Normal rate and regular rhythm.     Heart sounds: No murmur heard.    No friction rub. No gallop.  Pulmonary:     Effort: Pulmonary effort is normal. No respiratory distress.     Breath sounds: Normal breath sounds. No wheezing, rhonchi or rales.  Abdominal:     General: Bowel sounds are normal. There is no distension.     Palpations: Abdomen is soft. There is no mass.     Tenderness: There is no abdominal tenderness.   Musculoskeletal:        General: No swelling.     Right lower leg: No edema.     Left lower leg: No edema.  Lymphadenopathy:     Cervical: No cervical adenopathy.     Upper Body:     Right upper body: No supraclavicular or axillary adenopathy.     Left upper body: No supraclavicular or axillary adenopathy.     Lower Body: No right inguinal adenopathy. No left inguinal adenopathy.  Skin:    General: Skin is warm.     Coloration: Skin is not jaundiced.     Findings: No lesion or rash.  Neurological:     General: No focal deficit present.     Mental Status: He is alert and oriented to person, place, and time. Mental status is at baseline.  Psychiatric:        Mood and Affect: Mood normal.        Behavior: Behavior normal.        Thought Content: Thought content normal.    PATHOLOGY:  His right adrenal gland biopsy revealed the following:   LABS:      Latest Ref Rng & Units 09/16/2022   12:00 AM 06/13/2009   11:01 AM  CBC  WBC  6.6     7.4   Hemoglobin 13.5 - 17.5 10.4     14.6  Hematocrit 41 - 53 33     44.3   Platelets 150 - 400 K/uL 265     163.0      This result is from an external source.      Latest Ref Rng & Units 09/16/2022   12:00 AM 04/22/2018    3:41 PM 10/07/2017    3:08 PM  CMP  BUN 4 - '21 27      17   '$ Creatinine 0.6 - 1.3 1.6     1.50  1.94   Sodium 137 - 147 138        Potassium 3.5 - 5.1 mEq/L 4.7        Chloride 99 - 108 104        CO2 13 - 22 28        Calcium 8.7 - 10.7 9.2        Alkaline Phos 25 - 125 75        AST 14 - 40 23        ALT 10 - 40 U/L 9           This result is from an external source.   ASSESSMENT & PLAN:  An 88 y.o. male who unfortunately has metastatic lung adenocarcinoma, as proven per a recent right adrenal gland biopsy.  The patient understands he is ultimately dealing with incurable disease.  Moving forward, I will give him the best therapy to keep his disease under control for as long as possible.  As mentioned  previously, he had lung cancer surgery 14 years ago, which showed well-differentiated lung adenocarcinoma.  I will send this gentleman's peripheral blood to [facility] to determine if his cancer harbors a mutation/alteration which would make his disease amenable to some form of targeted therapy.  I will also have this gentleman undergo a PET scan to determine all of his areas of metastatic disease.  I will see him back next week to go over his PET scan and Guardant[facility] test results, as well as their implications.  The patient understands all the plans discussed today and is in agreement with them.   Shefali Ng Macarthur Critchley, MD

## 2022-10-07 ENCOUNTER — Inpatient Hospital Stay: Payer: Medicare HMO

## 2022-10-07 ENCOUNTER — Inpatient Hospital Stay: Payer: Medicare HMO | Attending: Oncology | Admitting: Oncology

## 2022-10-07 VITALS — BP 121/64 | HR 102 | Temp 97.6°F | Resp 20 | Ht 72.0 in | Wt 206.4 lb

## 2022-10-07 DIAGNOSIS — C3491 Malignant neoplasm of unspecified part of right bronchus or lung: Secondary | ICD-10-CM

## 2022-10-07 DIAGNOSIS — C3490 Malignant neoplasm of unspecified part of unspecified bronchus or lung: Secondary | ICD-10-CM | POA: Insufficient documentation

## 2022-10-08 ENCOUNTER — Other Ambulatory Visit: Payer: Self-pay | Admitting: Oncology

## 2022-10-08 DIAGNOSIS — C3491 Malignant neoplasm of unspecified part of right bronchus or lung: Secondary | ICD-10-CM

## 2022-10-09 ENCOUNTER — Inpatient Hospital Stay: Payer: Medicare HMO | Admitting: Licensed Clinical Social Worker

## 2022-10-09 DIAGNOSIS — C3490 Malignant neoplasm of unspecified part of unspecified bronchus or lung: Secondary | ICD-10-CM

## 2022-10-09 NOTE — Progress Notes (Signed)
Brent Mcguire Work  Initial Assessment   Brent Mcguire is a 88 y.o. year old male contacted by phone. Clinical Social Work was referred by medical provider for assessment of psychosocial needs.   SDOH (Social Determinants of Health) assessments performed: Yes SDOH Interventions    Flowsheet Row Clinical Support from 10/09/2022 in [facility] Interventions   Alcohol Usage Interventions Intervention Not Indicated (Score <7)  Financial Strain Interventions Intervention Not Indicated  Physical Activity Interventions Intervention Not Indicated  Stress Interventions Intervention Not Indicated  Social Connections Interventions Intervention Not Indicated       SDOH Screenings   Food Insecurity: No Food Insecurity (09/16/2022)  Housing: Low Risk  (09/16/2022)  Transportation Needs: No Transportation Needs (09/16/2022)  Utilities: Not At Risk (09/16/2022)  Alcohol Screen: Low Risk  (10/09/2022)  Depression (PHQ2-9): Low Risk  (09/16/2022)  Financial Resource Strain: Low Risk  (10/09/2022)  Physical Activity: Inactive (10/09/2022)  Social Connections: Moderately Isolated (10/09/2022)  Stress: No Stress Concern Present (10/09/2022)  Tobacco Use: Medium Risk (09/16/2022)     Distress Screen completed: Yes    07/16/2017   10:39 AM  ONCBCN DISTRESS SCREENING  Screening Type Initial Screening  Distress experienced in past week (1-10) 0  Physician notified of physical symptoms Yes      Family/Social Information:  Housing Arrangement: patient lives alone main contacts, niece Brent Mcguire [phone]  and nephew Brent Mcguire [phone]  Family members/support persons in your life? Family and Medical Staff Transportation concerns: no  Employment: Retired  .  Income source: Paediatric nurse concerns: No Type of concern: None Food access concerns: no Religious or spiritual practice: Not known Services Currently in place:    [name]  Coping/ Adjustment to diagnosis: Patient understands treatment plan and what happens next? yes, waiting on CT scan Concerns about diagnosis and/or treatment: I'm not especially worried about anything Patient reported stressors:  no immediate needs or concerns Hopes and/or priorities: N/A Patient enjoys watching TV Current coping skills/ strengths: Active sense of humor , Average or above average intelligence , Capable of independent living , Communication skills , Scientist, research (life sciences) , General fund of knowledge , Motivation for treatment/growth , Physical Health , Religious Affiliation , Supportive family/friends , and Work skills     SUMMARY: Current SDOH Barriers:  Financial constraints related to fixed income and Limited social support  Clinical Social Work Clinical Goal(s):  No clinical social work goals at this time  Interventions: Discussed common feeling and emotions when being diagnosed with cancer, and the importance of support during treatment Informed patient of the support team roles and support services at [facility] Provided [name] contact information and encouraged patient to call with any questions or concerns Provided patient with information about CSW role in patient care and other available resources.   Follow Up Plan: Patient will contact CSW with any support or resource needs Patient verbalizes understanding of plan: Yes    Brent Swindler, LCSW

## 2022-10-17 ENCOUNTER — Ambulatory Visit: Payer: Medicare HMO | Admitting: Oncology

## 2022-10-17 NOTE — Progress Notes (Signed)
[facility]  [address] [place],  [place]  [address] [phone]  Clinic Day:  10/18/2022  Referring physician: [name], MD  HISTORY OF PRESENT ILLNESS:  The patient is a 88 y.o. male with metastatic lung adenocarcinoma.  This was recently proven per a right adrenal gland lymph node biopsy.  He comes in today to go over his PET scan and Guardant360 results to determine both the extent of his disease and if some form of targeted therapy can be used to treat it.  Since his last visit, the patient has been doing okay.  He complains of lower back and left hip pain which he attributes to arthritis.  He denies having any respiratory problems from his underlying lung cancer.  With respect to his lung cancer history, he underwent a right lower lobe lobectomy in December 2010, which revealed a 1.7 cm well-differentiated adenocarcinoma.  11 lymph nodes were removed, for which none contained cancer.  As this was a stage IA lung adenocarcinoma, adjuvant chemotherapy was not given.    PHYSICAL EXAM:  Blood pressure (!) 120/56, pulse 98, temperature (!) 97.5 F (36.4 C), resp. rate 20, height 6' (1.829 m), weight 189 lb 12.8 oz (86.1 kg), SpO2 93 %. Wt Readings from Last 3 Encounters:  10/18/22 189 lb 12.8 oz (86.1 kg)  10/07/22 206 lb 6.4 oz (93.6 kg)  09/16/22 220 lb (99.8 kg)   Body mass index is 25.74 kg/m. Performance status (ECOG): 1 - Symptomatic but completely ambulatory Physical Exam Constitutional:      Appearance: Normal appearance. He is not ill-appearing.  HENT:     Mouth/Throat:     Mouth: Mucous membranes are moist.     Pharynx: Oropharynx is clear. No oropharyngeal exudate or posterior oropharyngeal erythema.  Cardiovascular:     Rate and Rhythm: Normal rate and regular rhythm.     Heart sounds: No murmur heard.    No friction rub. No gallop.  Pulmonary:     Effort: Pulmonary effort is normal. No respiratory distress.     Breath  sounds: Normal breath sounds. No wheezing, rhonchi or rales.  Abdominal:     General: Bowel sounds are normal. There is no distension.     Palpations: Abdomen is soft. There is no mass.     Tenderness: There is no abdominal tenderness.  Musculoskeletal:        General: No swelling.     Right lower leg: No edema.     Left lower leg: No edema.  Lymphadenopathy:     Cervical: No cervical adenopathy.     Upper Body:     Right upper body: No supraclavicular or axillary adenopathy.     Left upper body: No supraclavicular or axillary adenopathy.     Lower Body: No right inguinal adenopathy. No left inguinal adenopathy.  Skin:    General: Skin is warm.     Coloration: Skin is not jaundiced.     Findings: No lesion or rash.  Neurological:     General: No focal deficit present.     Mental Status: He is alert and oriented to person, place, and time. Mental status is at baseline.  Psychiatric:        Mood and Affect: Mood normal.        Behavior: Behavior normal.        Thought Content: Thought content normal.   SCANS:  His PET scan revealed the following:  FINDINGS: Mediastinal blood pool activity:  SUV max 2.9  Liver activity: SUV max Not applicable.  NECK: left frontotemporal metastasis including at a S.U.V. max of 13.9, corresponding to edema on 04/04.Minimal left to right midline shift.  Right posterior triangle 6 mm node measures a S.U.V. max of 8.4 on 24/4.  Incidental CT findings: Carotid atherosclerosis. No cervical adenopathy.  CHEST: No pulmonary parenchymal hypermetabolism. Hypermetabolic AP window and prevascular nodal metastasis. Example prevascular node at 1.7 cm and a S.U.V. max of 15.7 on 53/4.  Small hiatal hernia. A focus of hypermetabolism at the gastroesophageal junction is without well-defined CT correlate and measures a S.U.V. max of 6.0 on 75/4.  Incidental CT findings: Cardiomegaly. Aortic and coronary artery calcification. Right lower lobectomy.  Centrilobular emphysema. Left pleural lipoma. Probable scarring in the left apex, similar.  ABDOMEN/PELVIS: Multiple splenic foci of hypermetabolism consistent with metastasis. Bilateral hypermetabolic adrenal masses. Example 3.0 cm and a S.U.V. max of 16.1 on the right. Increased from 2.5 cm on 07/02/2022.  On the left at 3.5 cm and a S.U.V. max of 16.7 on 88/4 today. Increased from 2.8 cm on 07/02/2022.  Incidental CT findings: 2.2 cm lower pole right renal low-density lesion is likely a cyst . In the absence of clinically indicated signs/symptoms require(s) no independent follow-up. Cholecystectomy. Abdominal aortic atherosclerosis. Right-sided bladder diverticulum. Mild prostatomegaly.  SKELETON: Multifocal osseous metastasis. An expansile lytic lesion involving the left iliac wing measures 5.3 cm and a S.U.V. max of 14.5 on 127/4. Markedly increased since the prior CT.  An L5 eccentric left lesion measures a S.U.V. max of 20.4.  Left-sided intra-articular facet lytic lesion at approximately C3-4 measures a S.U.V. max of 15.2 on 25/4.  A lytic left acetabular lesion measures a S.U.V. max of 18.5 on 144/4.  Incidental CT findings: None.  IMPRESSION: 1. Widespread metastatic disease, including brain, thoracoabdominal nodes, spleen, bilateral adrenal glands, and bones. 2. Recommend further evaluation of intracranial metastasis, ideally with pre and post-contrast brain MR. 3. Progressive osseous metastasis, including a lytic lesion in the left acetabulum which may predispose the patient to pathologic fracture with weight-bearing. Consider orthopedic consultation. 4. Small hiatal hernia with focal hypermetabolism at the GE junction, most likely indicative of esophagitis/gastritis. 5. Incidental findings, including: Prostatomegaly with presumed bladder outlet obstruction. Coronary artery atherosclerosis. Aortic Atherosclerosis (ICD10-I70.0). Emphysema  (ICD10-J43.9).  TESTING:  His Guardant[facility] test results revealed the following: +KRAS G12C mutation  PATHOLOGY:  His right adrenal gland biopsy revealed the following:   LABS:      Latest Ref Rng & Units 09/16/2022   12:00 AM 06/13/2009   11:01 AM  CBC  WBC  6.6     7.4   Hemoglobin 13.5 - 17.5 10.4     14.6   Hematocrit 41 - 53 33     44.3   Platelets 150 - 400 K/uL 265     163.0      This result is from an external source.      Latest Ref Rng & Units 09/16/2022   12:00 AM 04/22/2018    3:41 PM 10/07/2017    3:08 PM  CMP  BUN 4 - 21 27      17    Creatinine 0.6 - 1.3 1.6     1.50  1.94   Sodium 137 - 147 138        Potassium 3.5 - 5.1 mEq/L 4.7        Chloride 99 - 108 104        CO2 13 - 22  28        Calcium 8.7 - 10.7 9.2        Alkaline Phos 25 - 125 75        AST 14 - 40 23        ALT 10 - 40 U/L 9           This result is from an external source.   ASSESSMENT & PLAN:  An 88 y.o. male with metastatic lung adenocarcinoma, as proven per a recent right adrenal gland biopsy.  In clinic today, I went over all of his PET scan images with him, for which he could see he unfortunately has extensive disease.  The fact that his PET scan shows metastatic brain lesions is very concerning.  Based upon this finding, the patient will see radiation oncology to discuss whole brain radiation.  I will also arrange for him to receive a brain MRI as soon as possible.  The patient could also see his extensive metastatic bone and distant nodal lesions.  The patient understands his back and hip pain are due to metastatic lesions in these areas.  Radiation oncology will radiate these areas as well for pain relief.  Fortunately, his Guardant[facility] revealed a KRAS G12C mutation to where targeted therapy can be considered.  I will place him on adagrisib 600 mg BID immediately.  I will see him back in 4 weeks to see how well he is doing with his adagrisib therapy.  The patient understands all the plans  discussed today and is in agreement with them.   Duchess Armendarez Macarthur Critchley, MD

## 2022-10-18 ENCOUNTER — Telehealth: Payer: Self-pay | Admitting: Pharmacy Technician

## 2022-10-18 ENCOUNTER — Other Ambulatory Visit (HOSPITAL_COMMUNITY): Payer: Self-pay

## 2022-10-18 ENCOUNTER — Other Ambulatory Visit: Payer: Self-pay

## 2022-10-18 ENCOUNTER — Other Ambulatory Visit: Payer: Self-pay | Admitting: Oncology

## 2022-10-18 ENCOUNTER — Inpatient Hospital Stay: Payer: Medicare HMO | Admitting: Oncology

## 2022-10-18 VITALS — BP 120/56 | HR 98 | Temp 97.5°F | Resp 20 | Ht 72.0 in | Wt 189.8 lb

## 2022-10-18 DIAGNOSIS — C3490 Malignant neoplasm of unspecified part of unspecified bronchus or lung: Secondary | ICD-10-CM | POA: Diagnosis not present

## 2022-10-18 MED ORDER — ADAGRASIB 200 MG PO TABS
600.0000 mg | ORAL_TABLET | Freq: Two times a day (BID) | ORAL | 0 refills | Status: DC
  Filled 2022-10-18: qty 180, 30d supply, fill #0

## 2022-10-18 NOTE — Telephone Encounter (Signed)
Oral Oncology Patient Advocate Encounter  Received notification that the request for prior authorization for Brent Mcguire has been denied due to patient has not received at least one prior systemic therapy.  An urgent appeal for the prior authorization denial of Brent Mcguire has been started by the pharmacist.   This encounter will continue to be updated until final appeal determination.      Lady Deutscher, CPhT-Adv Oncology Pharmacy Patient [place] Direct Number: [phone]  Fax: [phone]

## 2022-10-18 NOTE — Telephone Encounter (Signed)
Oral Oncology Patient Advocate Encounter   Received notification that prior authorization for Brent Mcguire is required.   PA submitted on 10/18/22 Key [id number] Status is pending     Lady Deutscher, CPhT-Adv Oncology Pharmacy Patient [place] Direct Number: [phone]  Fax: [phone]

## 2022-10-20 ENCOUNTER — Other Ambulatory Visit: Payer: Self-pay | Admitting: Oncology

## 2022-10-21 ENCOUNTER — Telehealth: Payer: Self-pay

## 2022-10-21 ENCOUNTER — Other Ambulatory Visit: Payer: Self-pay | Admitting: Oncology

## 2022-10-21 MED ORDER — OXYCODONE HCL 10 MG PO TABS: ORAL_TABLET | ORAL | 0 refills | Status: DC

## 2022-10-21 NOTE — Telephone Encounter (Signed)
Oral Oncology Student Pharmacist Encounter  Received new prescription for adagrasib for the treatment of metastatic lung adenocarcinoma, planned duration until disease progression or unacceptable toxicity.  Labs from 09/16/2022 assessed, no interventions needed.  Pt's calculated CrCL is 41 mL/min.  Prescription dose and frequency assessed for appropriateness.  Current medication list in Epic reviewed, DDIs with adagrasib identified: losartan (adagrasib may increase the serum concentration of losartan; last dispensed: 90-day supply on 07/19/2022), tamsulosin (adagrasib may increase the serum concentration of tamsulosin; last dispensed: 90-day supply on 09/06/2022), carvedilol (adagrasib may increase the serum concentration of carvedilol; last dispensed: 90-day supply on 08/20/2022), and simvastatin (adagrasib may increase the serum concentration of simvastatin; last dispensed: 90-day supply on 07/30/2022).  Simvastatin and tamsulosin are absolutely contraindicated for use with adagrasib.   Evaluated chart and no patient barriers to medication adherence noted.   Patient agreement for treatment documented in MD note on 10/18/2022.  Prescription has been e-scribed to the [facility] for benefits analysis and approval.  Oral Oncology Clinic will continue to follow for insurance authorization, copayment issues, initial counseling and start date.  Donney Rankins, PharmD Candidate 10/21/2022 10:13 AM Oral Oncology Clinic [phone]

## 2022-10-22 ENCOUNTER — Telehealth: Payer: Self-pay

## 2022-10-22 ENCOUNTER — Telehealth: Payer: Self-pay | Admitting: Pharmacy Technician

## 2022-10-22 NOTE — Telephone Encounter (Signed)
Oral Oncology Patient Advocate Encounter  Therapy changed to [place].  Case will be reopened later if needed.    Lady Deutscher, CPhT-Adv Oncology Pharmacy Patient [place] Direct Number: [phone]  Fax: [phone]

## 2022-10-22 NOTE — Telephone Encounter (Signed)
Oral Oncology Patient Advocate Encounter  Received notification that the request for prior authorization for Lumakras has been denied due to patient has not tried and failed at least one prior systemic therapy.     An urgent appeal for the prior authorization denial of Lumakras has been started by the pharmacist.   This encounter will continue to be updated until final appeal determination.      Brent Mcguire, CPhT-Adv Oncology Pharmacy Patient [place] Direct Number: [phone]  Fax: [phone]

## 2022-10-22 NOTE — Telephone Encounter (Cosign Needed)
Oral Oncology Student Pharmacist Encounter  Received new prescription for sotorasib for the treatment of metastatic lung adenocarcinoma, planned duration until disease progression or unacceptable toxicity.  Labs from 09/16/2022 assessed, no interventions needed.  Pt's calculated CrCL is 41 mL/min.  Current medication list in Epic reviewed, DDIs with sotorasib identified: simvastatin (sotorasib may decrease the serum concentration of simvastatin; last dispensed: 90-day supply on 07/30/2022) and oxycodone (sotorasib may decrease the serum concentration of oxycodone; last dispensed: 20-day supply on 10/21/2022).  Evaluated chart and no patient barriers to medication adherence noted.   Patient agreement for treatment documented in MD note on   Prescription has been e-scribed to the [facility] for benefits analysis and approval.  Oral Oncology Clinic will continue to follow for insurance authorization, copayment issues, initial counseling and start date.  Donney Rankins, PharmD Candidate 10/22/2022 12:00 PM Oral Oncology Clinic [phone]

## 2022-10-22 NOTE — Telephone Encounter (Signed)
Oral Oncology Patient Advocate Encounter   Received notification that prior authorization for Lumakras is required.   PA submitted on 10/22/22 Key [id number] Status is pending     Lady Deutscher, CPhT-Adv Oncology Pharmacy Patient [place] Direct Number: [phone]  Fax: [phone]

## 2022-10-23 ENCOUNTER — Other Ambulatory Visit: Payer: Self-pay | Admitting: Oncology

## 2022-10-23 ENCOUNTER — Inpatient Hospital Stay: Payer: Medicare HMO

## 2022-10-23 ENCOUNTER — Ambulatory Visit: Payer: Medicare HMO | Admitting: Oncology

## 2022-10-23 MED ORDER — SOTORASIB 320 MG PO TABS
960.0000 mg | ORAL_TABLET | Freq: Every day | ORAL | 3 refills | Status: DC
  Filled 2022-10-23: qty 90, 30d supply, fill #0

## 2022-10-24 ENCOUNTER — Other Ambulatory Visit (HOSPITAL_COMMUNITY): Payer: Self-pay

## 2022-10-24 ENCOUNTER — Other Ambulatory Visit: Payer: Self-pay

## 2022-10-28 ENCOUNTER — Telehealth: Payer: Self-pay | Admitting: Pharmacy Technician

## 2022-10-28 NOTE — Telephone Encounter (Signed)
Oral Oncology Patient Advocate Encounter   Began application for assistance for Lumakras through [employer].   Application will be submitted upon completion of necessary supporting documentation.   [employer] phone number [phone].   I will continue to check the status until final determination.   Lady Deutscher, CPhT-Adv Oncology Pharmacy Patient [place] Direct Number: [phone]  Fax: [phone]

## 2022-10-28 NOTE — Telephone Encounter (Signed)
Oral Oncology Patient Advocate Encounter  Received notification that the appeal request for prior authorization for Lumakras has been denied due to patient has not tried and failed at least one prior systemic therapy.    Oral Oncology will pursue patient assistance for this therapy.   Brent Mcguire, CPhT-Adv Oncology Pharmacy Patient [place] Direct Number: [phone]  Fax: [phone]

## 2022-10-29 ENCOUNTER — Telehealth: Payer: Self-pay

## 2022-10-29 ENCOUNTER — Encounter: Payer: Medicare HMO | Admitting: Dietician

## 2022-10-29 NOTE — Telephone Encounter (Signed)
Received call from [place] w/[facility]. Pt was taken to ED, and will discharged to [facility]. I notified Dr Bobby Rumpf.

## 2022-10-29 NOTE — Telephone Encounter (Signed)
Oral Oncology Patient Advocate Encounter  Patient transferring care to Hospice. Application will be cancelled.  Brent Mcguire, CPhT-Adv Oncology Pharmacy Patient [place] Direct Number: [phone]  Fax: [phone]

## 2022-10-30 ENCOUNTER — Inpatient Hospital Stay: Payer: Medicare HMO

## 2022-11-11 ENCOUNTER — Encounter: Payer: Self-pay | Admitting: Oncology

## 2022-11-15 ENCOUNTER — Ambulatory Visit: Payer: Medicare HMO | Admitting: Oncology

## 2022-11-15 ENCOUNTER — Other Ambulatory Visit: Payer: Medicare HMO

## 2022-11-21 ENCOUNTER — Encounter: Payer: Medicare HMO | Admitting: Dietician

## 2022-11-27 NOTE — Telephone Encounter (Signed)
Oral Oncology Pharmacist Encounter  Patient has elected to go to hospice once discharged from the hospital. Oral oncology will sign off.   Drema Halon, PharmD Hematology/Oncology Clinical Pharmacist [facility] [phone]

## 2022-11-27 DEATH — deceased
